# Patient Record
Sex: Female | Born: 1990 | Race: White | Hispanic: No | Marital: Married | Smoking: Never smoker
Health system: Southern US, Community
[De-identification: ages and names within clinical notes are randomized; demographics above are authoritative.]

## PROBLEM LIST (undated history)

## (undated) DIAGNOSIS — F419 Anxiety disorder, unspecified: Secondary | ICD-10-CM

## (undated) DIAGNOSIS — D649 Anemia, unspecified: Secondary | ICD-10-CM

## (undated) DIAGNOSIS — G43909 Migraine, unspecified, not intractable, without status migrainosus: Secondary | ICD-10-CM

## (undated) DIAGNOSIS — J45909 Unspecified asthma, uncomplicated: Secondary | ICD-10-CM

## (undated) DIAGNOSIS — E785 Hyperlipidemia, unspecified: Secondary | ICD-10-CM

## (undated) HISTORY — DX: Hyperlipidemia, unspecified: E78.5

## (undated) HISTORY — DX: Anxiety disorder, unspecified: F41.9

## (undated) HISTORY — DX: Anemia, unspecified: D64.9

## (undated) HISTORY — DX: Unspecified asthma, uncomplicated: J45.909

---
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
## 2010-01-15 ENCOUNTER — Ambulatory Visit: Payer: Self-pay | Admitting: Internal Medicine

## 2010-01-24 ENCOUNTER — Encounter: Payer: Self-pay | Admitting: Internal Medicine

## 2010-01-27 ENCOUNTER — Encounter: Payer: Self-pay | Admitting: Internal Medicine

## 2010-02-02 ENCOUNTER — Telehealth: Payer: Self-pay | Admitting: Internal Medicine

## 2010-07-10 ENCOUNTER — Ambulatory Visit: Payer: Self-pay | Admitting: Internal Medicine

## 2010-07-13 ENCOUNTER — Telehealth: Payer: Self-pay | Admitting: Internal Medicine

## 2010-07-14 ENCOUNTER — Telehealth: Payer: Self-pay | Admitting: Internal Medicine

## 2010-07-15 ENCOUNTER — Encounter: Payer: Self-pay | Admitting: Internal Medicine

## 2010-08-03 ENCOUNTER — Ambulatory Visit: Payer: Self-pay | Admitting: Internal Medicine

## 2010-08-03 DIAGNOSIS — J3089 Other allergic rhinitis: Secondary | ICD-10-CM | POA: Insufficient documentation

## 2010-08-03 DIAGNOSIS — R519 Headache, unspecified: Secondary | ICD-10-CM | POA: Insufficient documentation

## 2010-08-03 DIAGNOSIS — R51 Headache: Secondary | ICD-10-CM

## 2010-08-04 ENCOUNTER — Telehealth (INDEPENDENT_AMBULATORY_CARE_PROVIDER_SITE_OTHER): Payer: Self-pay | Admitting: *Deleted

## 2010-08-06 ENCOUNTER — Telehealth: Payer: Self-pay | Admitting: Internal Medicine

## 2010-08-10 ENCOUNTER — Ambulatory Visit (HOSPITAL_COMMUNITY): Admission: RE | Admit: 2010-08-10 | Discharge: 2010-08-10 | Payer: Self-pay | Admitting: Internal Medicine

## 2010-08-13 ENCOUNTER — Emergency Department (HOSPITAL_COMMUNITY)
Admission: EM | Admit: 2010-08-13 | Discharge: 2010-08-13 | Payer: Self-pay | Source: Home / Self Care | Admitting: Emergency Medicine

## 2010-08-13 ENCOUNTER — Ambulatory Visit: Payer: Self-pay | Admitting: Internal Medicine

## 2010-08-13 DIAGNOSIS — R10813 Right lower quadrant abdominal tenderness: Secondary | ICD-10-CM

## 2010-08-13 LAB — CONVERTED CEMR LAB
AST: 21 units/L (ref 0–37)
Albumin: 4.8 g/dL (ref 3.5–5.2)
Alkaline Phosphatase: 56 units/L (ref 39–117)
Basophils Absolute: 0 10*3/uL (ref 0.0–0.1)
Bilirubin Urine: NEGATIVE
Bilirubin, Direct: 0.1 mg/dL (ref 0.0–0.3)
CO2: 28 meq/L (ref 19–32)
Calcium: 9.5 mg/dL (ref 8.4–10.5)
Creatinine, Ser: 0.8 mg/dL (ref 0.4–1.2)
Eosinophils Absolute: 0 10*3/uL (ref 0.0–0.7)
GFR calc non Af Amer: 96.02 mL/min (ref >60)
Glucose, Bld: 111 mg/dL — ABNORMAL HIGH (ref 70–99)
Leukocytes, UA: NEGATIVE
Lipase: 37 units/L (ref 11.0–59.0)
Lymphocytes Relative: 16.6 % (ref 12.0–46.0)
MCHC: 34.2 g/dL (ref 30.0–36.0)
Monocytes Relative: 5.7 % (ref 3.0–12.0)
Neutrophils Relative %: 77.2 % — ABNORMAL HIGH (ref 43.0–77.0)
Nitrite: NEGATIVE
Platelets: 177 10*3/uL (ref 150.0–400.0)
RDW: 13.1 % (ref 11.5–14.6)
Sodium: 141 meq/L (ref 135–145)
Specific Gravity, Urine: 1.015 (ref 1.000–1.030)
Total Bilirubin: 0.7 mg/dL (ref 0.3–1.2)
pH: 8.5 (ref 5.0–8.0)

## 2010-08-14 ENCOUNTER — Telehealth: Payer: Self-pay | Admitting: Internal Medicine

## 2010-08-14 ENCOUNTER — Ambulatory Visit: Payer: Self-pay | Admitting: Internal Medicine

## 2010-08-14 DIAGNOSIS — N760 Acute vaginitis: Secondary | ICD-10-CM | POA: Insufficient documentation

## 2010-08-14 DIAGNOSIS — N83209 Unspecified ovarian cyst, unspecified side: Secondary | ICD-10-CM | POA: Insufficient documentation

## 2010-10-05 ENCOUNTER — Encounter: Payer: Self-pay | Admitting: Internal Medicine

## 2010-10-27 NOTE — Progress Notes (Signed)
  Phone Note Call from Patient Call back at Home Phone [phone]   Caller: Patient Call For: Etta Grandchild MD Summary of Call: Pt needs note for her college  professors as she has missed 2 days of classes due to being sick.  Initial call taken by: Verdell Face,  August 14, 2010 11:29 AM  Follow-up for Phone Call        Patient picked up letter.Alvy Beal Archie CMA  August 14, 2010 2:12 PM

## 2010-10-27 NOTE — Assessment & Plan Note (Signed)
Summary: sinus inf/migraines/cd   Vital Signs:  Patient profile:   20 year old female Menstrual status:  regular LMP:     07/28/2010 Height:      60 inches Weight:      112 pounds BMI:     21.95 O2 Sat:      98 % on Room air Temp:     97.7 degrees F oral Pulse rate:   80 / minute Pulse rhythm:   regular Resp:     16 per minute BP sitting:   90 / 60  (left arm) Cuff size:   regular  Vitals Entered By: Alysia Penna (August 03, 2010 2:37 PM)  O2 Flow:  Room air CC: pt c/o headache & congestion. /cp sma, Headaches LMP (date): 07/28/2010     Enter LMP: 07/28/2010   Primary Care Provider:  Etta Grandchild MD  CC:  pt c/o headache & congestion. /cp sma and Headaches.  History of Present Illness:  Headaches      This is a 20 year old woman who presents with Headaches.  The symptoms began >1 year ago.  On a scale of 1 to 10, the intensity is described as a 4.  The patient reports nasal congestion, sinus pain, sinus pressure, photophobia, and phonophobia, but denies nausea, vomiting, sweats, and tearing of eyes.  The headache is described as intermittent and pressure-like.  The location of the pain is bitemporal.  The patient denies the following high-risk features: fever, neck pain/stiffness, vision loss or change, focal weakness, altered mental status, rash, trauma, pain worse with exertion, new type of headache, age >50 years, immunosuppression, concomitant infection, and anticoagulation use.  The headaches are precipitated by stress.  Prior treatment has included acetaminophen.    Asthma History    Initial Asthma Severity Rating:    Age range: 12+ years    Symptoms: 0-2 days/week    Nighttime Awakenings: 0-2/month    Interferes w/ normal activity: no limitations    SABA use (not for EIB): 0-2 days/week    Asthma Severity Assessment: Intermittent   Preventive Screening-Counseling & Management  Alcohol-Tobacco     Alcohol drinks/day: 0     Alcohol Counseling: not  indicated; patient does not drink     Smoking Status: never     Tobacco Counseling: not indicated; no tobacco use  Hep-HIV-STD-Contraception     Hepatitis Risk: no risk noted     HIV Risk: no risk noted     STD Risk: no risk noted      Sexual History:  currently monogamous.        Drug Use:  no.        Blood Transfusions:  no.    Medications Prior to Update: 1)  Nuvaring 0.12-0.015 Mg/24hr Ring (Etonogestrel-Ethinyl Estradiol) 2)  Symbicort 160-4.5 Mcg/act Aero (Budesonide-Formoterol Fumarate) .... 2 Inh Two Times A Day  Current Medications (verified): 1)  Nuvaring 0.12-0.015 Mg/24hr Ring (Etonogestrel-Ethinyl Estradiol) 2)  Symbicort 160-4.5 Mcg/act Aero (Budesonide-Formoterol Fumarate) .... 2 Inh Two Times A Day  Allergies (verified): 1)  ! * Adhesives  Past History:  Past Medical History: Last updated: 10/27/2009 Asthma Hyperlipidemia  Past Surgical History: Last updated: 10/27/2009 Denies surgical history  Family History: Last updated: 10/27/2009 Family History of Prostate CA 1st degree relative <50  Social History: Last updated: 10/27/2009 Single Never Smoked Alcohol use-no Drug use-no Regular exercise-yes  Risk Factors: Alcohol Use: 0 (08/03/2010) Exercise: yes (10/27/2009)  Risk Factors: Smoking Status: never (08/03/2010)  Family History:  Reviewed history from 10/27/2009 and no changes required. Family History of Prostate CA 1st degree relative <50  Social History: Reviewed history from 10/27/2009 and no changes required. Single Never Smoked Alcohol use-no Drug use-no Regular exercise-yes  Review of Systems ENT:  Complains of nasal congestion and sinus pressure; denies decreased hearing, difficulty swallowing, ear discharge, earache, hoarseness, nosebleeds, postnasal drainage, ringing in ears, and sore throat.  Physical Exam  General:  alert, well-developed, well-nourished, well-hydrated, appropriate dress, normal appearance,  healthy-appearing, cooperative to examination, and good hygiene.   Head:  normocephalic, atraumatic, no abnormalities observed, and no abnormalities palpated.   Eyes:  No corneal or conjunctival inflammation noted. EOMI. Perrla. Funduscopic exam benign, without hemorrhages, exudates or papilledema. Vision grossly normal. Ears:  R ear normal and L ear normal.   Nose:  no external deformity, no nasal discharge, no airflow obstruction, no intranasal foreign body, no nasal polyps, no nasal mucosal lesions, no mucosal friability, no active bleeding or clots, no sinus percussion tenderness, no septum abnormalities, no nasal discharge, +mucosal pallor, and mucosal edema.   Mouth:  Oral mucosa and oropharynx without lesions or exudates.  Teeth in good repair. Neck:  supple, full ROM, no masses, no thyromegaly, no JVD, normal carotid upstroke, no carotid bruits, no cervical lymphadenopathy, and no neck tenderness.   Lungs:  normal respiratory effort, no intercostal retractions, no accessory muscle use, normal breath sounds, no dullness, no fremitus, no crackles, and no wheezes.   Heart:  normal rate, regular rhythm, no murmur, no gallop, no rub, and no JVD.   Abdomen:  soft, non-tender, normal bowel sounds, no distention, no masses, no guarding, no rigidity, no rebound tenderness, no abdominal hernia, no inguinal hernia, no hepatomegaly, and no splenomegaly.   Msk:  No deformity or scoliosis noted of thoracic or lumbar spine.   Pulses:  R and L carotid,radial,femoral,dorsalis pedis and posterior tibial pulses are full and equal bilaterally Extremities:  No clubbing, cyanosis, edema, or deformity noted with normal full range of motion of all joints.   Neurologic:  No cranial nerve deficits noted. Station and gait are normal. Plantar reflexes are down-going bilaterally. DTRs are symmetrical throughout. Sensory, motor and coordinative functions appear intact. Skin:  Intact without suspicious lesions or  rashes Cervical Nodes:  no anterior cervical adenopathy and no posterior cervical adenopathy.   Axillary Nodes:  no R axillary adenopathy and no L axillary adenopathy.   Inguinal Nodes:  no R inguinal adenopathy and no L inguinal adenopathy.   Psych:  Cognition and judgment appear intact. Alert and cooperative with normal attention span and concentration. No apparent delusions, illusions, hallucinations   Impression & Recommendations:  Problem # 1:  ALLERGIC RHINITIS DUE TO OTHER ALLERGEN (ICD-477.8) Assessment New  Orders: Admin of Therapeutic Inj  intramuscular or subcutaneous (69629) Depo- Medrol 40mg  (J1030) Depo- Medrol 80mg  (J1040)  Problem # 2:  HEADACHE (ICD-784.0) It sounds like she is having severe migraine headaches about 4 times per month so I have referred her to a headache specialist to consider starting a prophylactic med. Orders: Neurology Referral (Neuro)  Complete Medication List: 1)  Nuvaring 0.12-0.015 Mg/24hr Ring (Etonogestrel-ethinyl estradiol) 2)  Symbicort 160-4.5 Mcg/act Aero (Budesonide-formoterol fumarate) .... 2 inh two times a day  Patient Instructions: 1)  Please schedule a follow-up appointment in 1 month.   Medication Administration  Injection # 1:    Medication: Depo- Medrol 80mg     Diagnosis: ALLERGIC RHINITIS DUE TO OTHER ALLERGEN (ICD-477.8)    Route: IM  Site: LUOQ gluteus    Exp Date: 01/2013    Lot #: [id number]    Mfr: [employer]    Patient tolerated injection without complications    Given by: Ami Bullins CMA (August 03, 2010 2:59 PM)  Injection # 2:    Medication: Depo- Medrol 40mg     Diagnosis: ALLERGIC RHINITIS DUE TO OTHER ALLERGEN (ICD-477.8)    Route: IM    Site: LUOQ gluteus    Exp Date: 01/2013    Lot #: [id number]    Mfr: [employer]  Orders Added: 1)  Neurology Referral [Neuro] 2)  Admin of Therapeutic Inj  intramuscular or subcutaneous [96372] 3)  Depo- Medrol 40mg  [J1030] 4)  Depo- Medrol 80mg  [J1040] 5)  Est.  Patient Level V [81191]

## 2010-10-27 NOTE — Progress Notes (Signed)
Summary: MRI  Phone Note Call from Patient Call back at Home Phone [phone]   Caller: Leanne Lovely Summary of Call: Pt's mother called stating that pt's pediatrician is conerned that pt was referred to have MRI Brain w/ contrast with her family history of death due to aneurysm (Aunt) and brain hemmorhage (Grandmother). Pt's mother said she called earlier this week with this concerned and was advised that she would be contacted by Dr Yetta Barre. Initial call taken by: Margaret Pyle, CMA,  August 06, 2010 1:58 PM  Follow-up for Phone Call        i did not get this info in the prior message, tell her I am very sorry that no one told me of the family history and the request for an MRI Follow-up by: Etta Grandchild MD,  August 06, 2010 2:36 PM  Additional Follow-up for Phone Call Additional follow up Details #1::        Pt informed  Additional Follow-up by: Lamar Sprinkles, CMA,  August 06, 2010 2:53 PM

## 2010-10-27 NOTE — Progress Notes (Signed)
Summary: referral  ---- Converted from flag ---- ---- 08/04/2010 10:03 AM, Etta Grandchild MD wrote: yes  ---- 08/04/2010 9:51 AM, Shelbie Proctor wrote: Dr Yetta Barre  called Dr lewitt office  reguarding  referral The next available appt is Late Dec -early Jan before pt can get in , pt mother want to  know can [facility] be [facility] pls advise ------------------------------

## 2010-10-27 NOTE — Progress Notes (Signed)
Summary: Out of school note and RX ?  Phone Note Call from Patient Call back at Home Phone [phone]   Caller: Mom Summary of Call: Pt's mother called stating pt need Dr note for work and school for Sunday and Today. Mom also states that meds called in at [facility] were not sent to the correct pharmacy. Please call pt mother back. Initial call taken by: Margaret Pyle, CMA,  July 13, 2010 2:46 PM  Follow-up for Phone Call        I spoke to pt's mother this am. She states pt contacted on call doc lastnight and has everything she needs now. Close phone note Follow-up by: Lanier Prude, [facility]),  July 14, 2010 8:50 AM

## 2010-10-27 NOTE — Assessment & Plan Note (Signed)
Summary: NEW/[employer]/CD   Vital Signs:  Patient profile:   20 year old female Menstrual status:  regular LMP:     10/01/2009 Height:      60 inches Weight:      103 pounds BMI:     20.19 O2 Sat:      96 % on Room air Temp:     98.7 degrees F oral Pulse rate:   68 / minute Pulse rhythm:   regular Resp:     16 per minute BP sitting:   108 / 82  (left arm) Cuff size:   large  Vitals Entered By: Rock Nephew CMA (October 27, 2009 2:56 PM)  O2 Flow:  Room air  Primary Care Provider:  Etta Grandchild MD  CC:  URI symptoms.  History of Present Illness:  URI Symptoms      This is an 20 year old woman who presents with URI symptoms.  The symptoms began 2 weeks ago.  The severity is described as moderate.  The patient reports nasal congestion, purulent nasal discharge, productive cough, and sick contacts, but denies earache.  The patient denies fever, stiff neck, dyspnea, wheezing, rash, vomiting, diarrhea, and use of an antipyretic.  The patient denies headache, muscle aches, and severe fatigue.  Risk factors for Strep sinusitis include unilateral facial pain, unilateral nasal discharge, poor response to decongestant, double sickening, and absence of cough.  The patient denies the following risk factors for Strep sinusitis: tender adenopathy.    Preventive Screening-Counseling & Management  Alcohol-Tobacco     Alcohol drinks/day: 0     Smoking Status: never  Caffeine-Diet-Exercise     Does Patient Exercise: yes  Hep-HIV-STD-Contraception     Hepatitis Risk: no risk noted     HIV Risk: no risk noted     STD Risk: no risk noted      Sexual History:  currently monogamous.        Drug Use:  no.        Blood Transfusions:  no.    Medications Prior to Update: 1)  None  Current Medications (verified): 1)  Nuvaring 0.12-0.015 Mg/24hr Ring (Etonogestrel-Ethinyl Estradiol) 2)  Amoxicillin 500 Mg Cap (Amoxicillin) .... Take 1 Capsule By Mouth Three Times A Day X 10 Days 3)   Guaifenesin Dac 30-10-100 Mg/88ml Soln (Pseudoephedrine-Codeine-Gg) .... 5-10 Ml By Mouth Qid As Needed For Cough and Congestion  Allergies (verified): 1)  ! * Adhesives  Past History:  Past Medical History: Asthma Hyperlipidemia  Past Surgical History: Denies surgical history  Family History: Family History of Prostate CA 1st degree relative <50  Social History: Single Never Smoked Alcohol use-no Drug use-no Regular exercise-yes Smoking Status:  never Hepatitis Risk:  no risk noted HIV Risk:  no risk noted STD Risk:  no risk noted Sexual History:  currently monogamous Blood Transfusions:  no Drug Use:  no Does Patient Exercise:  yes  Review of Systems  The patient denies anorexia, fever, weight loss, chest pain, headaches, hemoptysis, abdominal pain, hematuria, suspicious skin lesions, and enlarged lymph nodes.    Physical Exam  General:  alert, well-developed, well-nourished, well-hydrated, appropriate dress, normal appearance, healthy-appearing, and cooperative to examination.   Head:  normocephalic, atraumatic, no abnormalities observed, and no abnormalities palpated.   Ears:  R ear normal and L ear normal.   Nose:  no airflow obstruction, no intranasal foreign body, no nasal polyps, no nasal mucosal lesions, no mucosal friability, no active bleeding or clots, nasal dischargemucosal pallor, mucosal  edema, L maxillary sinus tenderness, and R maxillary sinus tenderness.   Mouth:  Oral mucosa and oropharynx without lesions or exudates.  Teeth in good repair. Neck:  No deformities, masses, or tenderness noted. Lungs:  Normal respiratory effort, chest expands symmetrically. Lungs are clear to auscultation, no crackles or wheezes. Heart:  Normal rate and regular rhythm. S1 and S2 normal without gallop, murmur, click, rub or other extra sounds. Abdomen:  soft, non-tender, normal bowel sounds, no distention, no masses, no guarding, no rigidity, no rebound tenderness, no  hepatomegaly, and no splenomegaly.   Msk:  normal ROM, no joint tenderness, no joint swelling, no joint warmth, no redness over joints, no joint deformities, no joint instability, and no crepitation.   Pulses:  R and L carotid,radial,femoral,dorsalis pedis and posterior tibial pulses are full and equal bilaterally Extremities:  No clubbing, cyanosis, edema, or deformity noted with normal full range of motion of all joints.   Neurologic:  No cranial nerve deficits noted. Station and gait are normal. Plantar reflexes are down-going bilaterally. DTRs are symmetrical throughout. Sensory, motor and coordinative functions appear intact. Skin:  Intact without suspicious lesions or rashes Cervical Nodes:  no anterior cervical adenopathy and no posterior cervical adenopathy.   Axillary Nodes:  no R axillary adenopathy and no L axillary adenopathy.   Inguinal Nodes:  no R inguinal adenopathy and no L inguinal adenopathy.   Psych:  Cognition and judgment appear intact. Alert and cooperative with normal attention span and concentration. No apparent delusions, illusions, hallucinations   Impression & Recommendations:  Problem # 1:  SINUSITIS- ACUTE-NOS (ICD-461.9) Assessment New  Her updated medication list for this problem includes:    Amoxicillin 500 Mg Cap (Amoxicillin) .Marland Kitchen... Take 1 capsule by mouth three times a day x 10 days    Guaifenesin Dac 30-10-100 Mg/42ml Soln (Pseudoephedrine-codeine-gg) .Marland Kitchen... 5-10 ml by mouth qid as needed for cough and congestion  Complete Medication List: 1)  Nuvaring 0.12-0.015 Mg/24hr Ring (Etonogestrel-ethinyl estradiol) 2)  Amoxicillin 500 Mg Cap (Amoxicillin) .... Take 1 capsule by mouth three times a day x 10 days 3)  Guaifenesin Dac 30-10-100 Mg/96ml Soln (Pseudoephedrine-codeine-gg) .... 5-10 ml by mouth qid as needed for cough and congestion  Patient Instructions: 1)  Please schedule a follow-up appointment in 2 weeks. 2)  Take your antibiotic as prescribed until  ALL of it is gone, but stop if you develop a rash or swelling and contact our office as soon as possible. 3)  Acute sinusitis symptoms for less than 10 days are not helped by antibiotics.Use warm moist compresses, and over the counter decongestants ( only as directed). Call if no improvement in 5-7 days, sooner if increasing pain, fever, or new symptoms. Prescriptions: GUAIFENESIN DAC 30-10-100 MG/5ML SOLN (PSEUDOEPHEDRINE-CODEINE-GG) 5-10 ml by mouth QID as needed for cough and congestion  #6 ounces x 0   Entered and Authorized by:   Etta Grandchild MD   Signed by:   Etta Grandchild MD on 10/27/2009   Method used:   Print then Give to Patient   RxID:   [id number] AMOXICILLIN 500 MG CAP (AMOXICILLIN) Take 1 capsule by mouth three times a day X 10 days  #30 x 0   Entered and Authorized by:   Etta Grandchild MD   Signed by:   Etta Grandchild MD on 10/27/2009   Method used:   Print then Give to Patient   RxID:   [id number]

## 2010-10-27 NOTE — Assessment & Plan Note (Signed)
Summary: 3 RD HEP A AND B/TLJ/WILL BRING RECORDS/NWS  Nurse Visit   Allergies: 1)  ! * Adhesives  Immunizations Administered:  HPV # 3:    Vaccine Type: Gardasil    Site: right deltoid    Mfr: Merck    Dose: 0.5 ml    Route: IM    Given by: Lamar Sprinkles, CMA    Exp. Date: 07/12/2011    Lot #: [id number]    VIS given: 10/29/05 version given January 15, 2010.  Orders Added: 1)  HPV Vaccine - 3 sched doses - IM [90649] 2)  Admin 1st Vaccine [29562]

## 2010-10-27 NOTE — Miscellaneous (Signed)
  Clinical Lists Changes  Observations: Added new observation of MENINGOC VAX: Historical (11/09/2004 10:21)      Immunization History:  Meningococcal Immunization History:    Meningococcal:  historical (11/09/2004)

## 2010-10-27 NOTE — Assessment & Plan Note (Signed)
Summary: FU ON CRAMPS/[employer]   Vital Signs:  Patient profile:   20 year old female Menstrual status:  regular Height:      60 inches Weight:      112 pounds BMI:     21.95 O2 Sat:      98 % on Room air Temp:     97.8 degrees F oral Pulse rate:   62 / minute Pulse rhythm:   regular Resp:     16 per minute BP sitting:   108 / 70  (left arm) Cuff size:   regular  Vitals Entered By: Rock Nephew CMA (August 14, 2010 10:02 AM)  O2 Flow:  Room air CC: ER follow up Is Patient Diabetic? No Pain Assessment Patient in pain? no       Does patient need assistance? Functional Status Self care Ambulation Normal   Primary Care Provider:  Etta Grandchild MD  CC:  ER follow up.  History of Present Illness: She returns for f/up and feels better with a decrease in her abd. pain, her CT scan yesterday in the ER showed a small right ovarian cyst but her appendix was normal. She had a pelvic exam done in the ER and is being treated for BV. She has not taken anything for pain or nausea since she left the ER.  Preventive Screening-Counseling & Management  Alcohol-Tobacco     Alcohol drinks/day: 0     Alcohol Counseling: not indicated; patient does not drink     Smoking Status: never     Tobacco Counseling: not indicated; no tobacco use  Hep-HIV-STD-Contraception     Hepatitis Risk: no risk noted     HIV Risk: no risk noted     STD Risk: no risk noted      Sexual History:  currently monogamous.        Drug Use:  no.        Blood Transfusions:  no.    Clinical Review Panels:  Immunizations   Last Tetanus Booster:  Td (03/28/2006)   Last Flu Vaccine:  Historical (06/23/2009)  Diabetes Management   Creatinine:  0.8 (08/13/2010)   Last Flu Vaccine:  Historical (06/23/2009)  CBC   WBC:  11.1 (08/13/2010)   RBC:  4.70 (08/13/2010)   Hgb:  14.9 (08/13/2010)   Hct:  43.5 (08/13/2010)   Platelets:  177.0 (08/13/2010)   MCV  92.7 (08/13/2010)   MCHC  34.2 (08/13/2010)  RDW  13.1 (08/13/2010)   PMN:  77.2 (08/13/2010)   Lymphs:  16.6 (08/13/2010)   Monos:  5.7 (08/13/2010)   Eosinophils:  0.2 (08/13/2010)   Basophil:  0.3 (08/13/2010)  Complete Metabolic Panel   Glucose:  111 (08/13/2010)   Sodium:  141 (08/13/2010)   Potassium:  4.3 (08/13/2010)   Chloride:  102 (08/13/2010)   CO2:  28 (08/13/2010)   BUN:  7 (08/13/2010)   Creatinine:  0.8 (08/13/2010)   Albumin:  4.8 (08/13/2010)   Total Protein:  7.6 (08/13/2010)   Calcium:  9.5 (08/13/2010)   Total Bili:  0.7 (08/13/2010)   Alk Phos:  56 (08/13/2010)   SGPT (ALT):  18 (08/13/2010)   SGOT (AST):  21 (08/13/2010)   Medications Prior to Update: 1)  Nuvaring 0.12-0.015 Mg/24hr Ring (Etonogestrel-Ethinyl Estradiol) 2)  Symbicort 160-4.5 Mcg/act Aero (Budesonide-Formoterol Fumarate) .... 2 Inh Two Times A Day  Current Medications (verified): 1)  Nuvaring 0.12-0.015 Mg/24hr Ring (Etonogestrel-Ethinyl Estradiol) 2)  Symbicort 160-4.5 Mcg/act Aero (Budesonide-Formoterol Fumarate) .... 2 Inh Two  Times A Day 3)  Flagyl 500 Mg Tabs (Metronidazole) .... Take 1 Tablet By Mouth Two Times A Day X14 4)  Zofran 8 Mg Tabs (Ondansetron Hcl) .... Every 4-6 Hours 5)  Hydrocodone-Acetaminophen 5-325 Mg Tabs (Hydrocodone-Acetaminophen) .Marland Kitchen.. 1-2 Every 4-6 Hours As Needed  Allergies (verified): 1)  ! * Adhesives  Past History:  Past Medical History: Last updated: 10/27/2009 Asthma Hyperlipidemia  Past Surgical History: Last updated: 10/27/2009 Denies surgical history  Family History: Last updated: 10/27/2009 Family History of Prostate CA 1st degree relative <50  Social History: Last updated: 10/27/2009 Single Never Smoked Alcohol use-no Drug use-no Regular exercise-yes  Risk Factors: Alcohol Use: 0 (08/14/2010) Exercise: yes (10/27/2009)  Risk Factors: Smoking Status: never (08/14/2010)  Family History: Reviewed history from 10/27/2009 and no changes required. Family History of  Prostate CA 1st degree relative <50  Social History: Reviewed history from 10/27/2009 and no changes required. Single Never Smoked Alcohol use-no Drug use-no Regular exercise-yes  Review of Systems       The patient complains of abdominal pain.  The patient denies anorexia, fever, hematuria, and enlarged lymph nodes.   GI:  Complains of abdominal pain; denies bloody stools, diarrhea, gas, hemorrhoids, indigestion, loss of appetite, nausea, vomiting, vomiting blood, and yellowish skin color. GU:  Denies abnormal vaginal bleeding, discharge, dysuria, genital sores, hematuria, incontinence, nocturia, urinary frequency, and urinary hesitancy.  Physical Exam  General:  alert, well-developed, well-nourished, well-hydrated, appropriate dress, normal appearance, healthy-appearing, cooperative to examination, and good hygiene.   Head:  normocephalic, atraumatic, no abnormalities observed, and no abnormalities palpated.   Eyes:  No corneal or conjunctival inflammation noted. EOMI. Perrla. Funduscopic exam benign, without hemorrhages, exudates or papilledema. Vision grossly normal. Ears:  R ear normal and L ear normal.   Nose:  External nasal examination shows no deformity or inflammation. Nasal mucosa are pink and moist without lesions or exudates. Mouth:  Oral mucosa and oropharynx without lesions or exudates.  Teeth in good repair. Neck:  supple, full ROM, no masses, no thyromegaly, no thyroid nodules or tenderness, no JVD, normal carotid upstroke, and no carotid bruits.   Lungs:  normal respiratory effort, no intercostal retractions, no accessory muscle use, normal breath sounds, no dullness, no fremitus, no crackles, and no wheezes.   Heart:  normal rate, regular rhythm, no murmur, no gallop, and no rub.   Abdomen:  soft, non-tender, normal bowel sounds, no distention, no masses, no guarding, no rigidity, no rebound tenderness, no abdominal hernia, no inguinal hernia, no hepatomegaly, and no  splenomegaly.   Msk:  No deformity or scoliosis noted of thoracic or lumbar spine.   Pulses:  R and L carotid,radial,femoral,dorsalis pedis and posterior tibial pulses are full and equal bilaterally Extremities:  No clubbing, cyanosis, edema, or deformity noted with normal full range of motion of all joints.   Neurologic:  No cranial nerve deficits noted. Station and gait are normal. Plantar reflexes are down-going bilaterally. DTRs are symmetrical throughout. Sensory, motor and coordinative functions appear intact. Skin:  Intact without suspicious lesions or rashes Cervical Nodes:  no anterior cervical adenopathy and no posterior cervical adenopathy.   Psych:  Cognition and judgment appear intact. Alert and cooperative with normal attention span and concentration. No apparent delusions, illusions, hallucinations   Impression & Recommendations:  Problem # 1:  ABDOMINAL TENDERNESS RIGHT LOWER QUADRANT (EAV-409.81) Assessment Improved  Problem # 2:  VAGINITIS, BACTERIAL (ICD-616.10) Assessment: New  Her updated medication list for this problem includes:    Flagyl 500  Mg Tabs (Metronidazole) .Marland Kitchen... Take 1 tablet by mouth two times a day x14  Problem # 3:  OVARIAN CYST (ICD-620.2) Assessment: New  Complete Medication List: 1)  Nuvaring 0.12-0.015 Mg/24hr Ring (Etonogestrel-ethinyl estradiol) 2)  Symbicort 160-4.5 Mcg/act Aero (Budesonide-formoterol fumarate) .... 2 inh two times a day 3)  Flagyl 500 Mg Tabs (Metronidazole) .... Take 1 tablet by mouth two times a day x14 4)  Zofran 8 Mg Tabs (Ondansetron hcl) .... Every 4-6 hours 5)  Hydrocodone-acetaminophen 5-325 Mg Tabs (Hydrocodone-acetaminophen) .Marland Kitchen.. 1-2 every 4-6 hours as needed  Patient Instructions: 1)  Please schedule a follow-up appointment in 2 weeks.   Orders Added: 1)  Est. Patient Level III [[address]]

## 2010-10-27 NOTE — Letter (Signed)
Summary: Out of [facility]  [address] [place], [place] [address]   Phone: [phone]  Fax: [phone]    August 14, 2010   Student:  Eileen Stanford    To Whom It May Concern:   For Medical reasons, please excuse the above named student from school for the following dates:  Start:   August 13, 2010  End:    August 17, 2010  If you need additional information, please feel free to contact our office.   Sincerely,    Etta Grandchild MD    ****This is a legal document and cannot be tampered with.  Schools are authorized to verify all information and to do so accordingly.

## 2010-10-27 NOTE — Progress Notes (Signed)
Summary: note  Phone Note Call from Patient Call back at Home Phone [phone]   Summary of Call: Patient left message on triage that she was sick on Friday and needs a note for the airport. Please advise. Initial call taken by: Lucious Groves,  Feb 02, 2010 10:41 AM  Follow-up for Phone Call        it would be a fraudulant act for me to write a note since I did not see her Friday Follow-up by: Etta Grandchild MD,  Feb 02, 2010 11:14 AM

## 2010-10-27 NOTE — Assessment & Plan Note (Signed)
Summary: cough/sniffles/Gina Stephenson/cd   Vital Signs:  Patient profile:   20 year old female Menstrual status:  regular Height:      60 inches Weight:      114 pounds BMI:     22.34 Temp:     97.5 degrees F oral Pulse rate:   76 / minute Pulse rhythm:   regular Resp:     16 per minute BP sitting:   102 / 68  (left arm) Cuff size:   regular  Vitals Entered By: Lanier Prude, Beverly Gust) (July 10, 2010 3:02 PM) CC: cough/congestion X 1 week Is Patient Diabetic? No   Primary Care Helane Briceno:  Etta Grandchild MD  CC:  cough/congestion X 1 week.  History of Present Illness: The patient presents with complaints of sore throat, fever, cough, sinus congestion and drainge of several days duration. Not better with OTC meds. Chest hurts with coughing. Can't sleep due to cough.   The mucus is colored.   Current Medications (verified): 1)  Nuvaring 0.12-0.015 Mg/24hr Ring (Etonogestrel-Ethinyl Estradiol) 2)  Guaifenesin Dac 30-10-100 Mg/59ml Soln (Pseudoephedrine-Codeine-Gg) .... 5-10 Ml By Mouth Qid As Needed For Cough and Congestion  Allergies (verified): 1)  ! * Adhesives  Past History:  Past Medical History: Last updated: 10/27/2009 Asthma Hyperlipidemia  Review of Systems  The patient denies fever and abdominal pain.    Physical Exam  General:  alert, well-developed, well-nourished, well-hydrated, appropriate dress, normal appearance, healthy-appearing, and cooperative to examination.   Eyes:  No corneal or conjunctival inflammation noted. EOMI. Perrla. Funduscopic exam benign, without hemorrhages, exudates or papilledema. Vision grossly normal. Ears:  External ear exam shows no significant lesions or deformities.  Otoscopic examination reveals clear canals, tympanic membranes are intact bilaterally without bulging, retraction, inflammation or discharge. Hearing is grossly normal bilaterally. Nose:  External nasal examination shows no deformity or inflammation. Nasal mucosa are  pink and moist without lesions or exudates. Mouth:  Erythematous throat and intranasal mucosa c/w URI  Lungs:  CTA Heart:  RRR Abdomen:  soft and non-tender.   Skin:  Intact without suspicious lesions or rashes   Impression & Recommendations:  Problem # 1:  BRONCHITIS, ACUTE (ICD-466.0) Assessment New     Zithromax Z-pak 250 Mg Tabs (Azithromycin) .Marland Kitchen... As dirrected    Symbicort 160-4.5 Mcg/act Aero (Budesonide-formoterol fumarate) .Marland Kitchen... 2 inh two times a day  Problem # 2:  ASTHMA (ICD-493.90) Assessment: Deteriorated  Her updated medication list for this problem includes:    Symbicort 160-4.5 Mcg/act Aero (Budesonide-formoterol fumarate) .Marland Kitchen... 2 inh two times a day  Complete Medication List: 1)  Nuvaring 0.12-0.015 Mg/24hr Ring (Etonogestrel-ethinyl estradiol) 2)  Zithromax Z-pak 250 Mg Tabs (Azithromycin) .... As dirrected 3)  Symbicort 160-4.5 Mcg/act Aero (Budesonide-formoterol fumarate) .... 2 inh two times a day  Patient Instructions: 1)  Use over-the-counter medicines for "cold": Tylenol  650mg  or Advil 400mg  every 6 hours  for fever; Delsym or Robutussin for cough. Mucinex or Mucinex D for congestion. Ricola or Halls for sore throat. Office visit if not better or if worse.  Prescriptions: SYMBICORT 160-4.5 MCG/ACT AERO (BUDESONIDE-FORMOTEROL FUMARATE) 2 inh two times a day  #1 x 3   Entered and Authorized by:   Tresa Garter MD   Signed by:   Tresa Garter MD on 07/10/2010   Method used:   Print then Give to Patient   RxID:   [id number] ZITHROMAX Z-PAK 250 MG TABS (AZITHROMYCIN) as dirrected  #1 x 0   Entered and Authorized by:  Tresa Garter MD   Signed by:   Tresa Garter MD on 07/10/2010   Method used:   Electronically to        [facility] [address] [phone]* (retail)       [address] [place], [place]  [address]       Ph: [phone]       Fax: [phone]   RxID:   [id number]

## 2010-10-27 NOTE — Progress Notes (Signed)
Summary: work/school note  Phone Note Call from Patient Call back at [phone]   Caller: Patient Reason for Call: Talk to Nurse Summary of Call: pt request work/school note from 07/10/10-07/14/10, request to pick up tomorrow by 10:30am Initial call taken by: Migdalia Dk,  July 14, 2010 3:26 PM

## 2010-10-27 NOTE — Miscellaneous (Signed)
Summary: [facility]  Clinical Lists Changes  Observations: Added new observation of HPV #2 DRUG: Gardasil (08/29/2009 12:37) Added new observation of HPV #1 DRUG: Gardasil (08/27/2009 12:37) Added new observation of FLU VAX: Historical (06/23/2009 12:36) Added new observation of HEPAVAX #2: Historical (10/10/2006 12:42) Added new observation of HEPAVAX #1: Historical (03/28/2006 12:42) Added new observation of TD BOOSTER: Td (03/28/2006 12:38) Added new observation of OPV #4: Historical (10/24/1997 12:41) Added new observation of MMR #2: Historical (10/12/1995 12:41) Added new observation of OPV #3: Historical (10/12/1995 12:41) Added new observation of HEPBVAX#3: Historical (10/27/1993 12:42) Added new observation of HEPBVAX#2: Historical (12/10/1992 12:42) Added new observation of HEPBVAX#1: Historical (11/03/1992 12:42) Added new observation of OPV #2: Historical (05/23/1992 12:41) Added new observation of DPT #4: Historical (05/23/1992 12:41) Added new observation of MMR #1: Historical (02/01/1992 12:41) Added new observation of DPT #3: Historical (05/04/1991 12:41) Added new observation of OPV #1: Historical (03/14/1991 12:41) Added new observation of DPT #2: Historical (03/14/1991 12:41) Added new observation of DPT #1: Historical (12/26/1990 12:41)      Immunization History:  Influenza Immunization History:    Influenza:  historical (06/23/2009)  HPV History:    HPV # 1:  gardasil (08/27/2009)    HPV # 2:  gardasil (08/29/2009)  Tetanus/Td Immunization History:    Tetanus/Td:  td (03/28/2006)  DPT Immunization History:    DPT # 1:  historical (12/26/1990)    DPT # 2:  historical (03/14/1991)    DPT # 3:  historical (05/04/1991)    DPT # 4:  historical (05/23/1992)  Polio Immunization History:    Polio # 1:  historical (03/14/1991)    Polio # 2:  historical (05/23/1992)    Polio # 3:  historical (10/12/1995)    Polio # 4:  historical  (10/24/1997)  MMR Immunization History:    MMR # 1:  historical (02/01/1992)    MMR # 2:  historical (10/12/1995)  Hepatitis B Immunization History:    Hepatitis B # 1:  historical (11/03/1992)    Hepatitis B # 2:  historical (12/10/1992)    Hepatitis B # 3:  historical (10/27/1993)  Hepatitis A Immunization History:    Hepatitis A # 1:  historical (03/28/2006)    Hepatitis A # 2:  historical (10/10/2006)

## 2010-10-27 NOTE — Progress Notes (Signed)
Summary: Call Report  Phone Note Other Incoming   Caller: [facility] Summary of Call: [facility] Triage Call Report Triage Record Num: [id number] Operator: Aundra Millet Patient Name: Gina Stephenson Call Date & Time: 07/13/2010 9:16:43PM Patient Phone: [phone] PCP: Patient Gender: Female PCP Fax : Patient DOB: [birth year] Practice Name: [facility] Reason for Call: Mom/ Sheri calling from [place]- Pt is in [place] in [place] and lives in [place] -- Pt had OV 07/10/2010 for cough/ congestion and prescribed rx . On 07/11/2010, pt went to pickup at [facility] . and it wasnt there. Mother and Pt called office about rx today, 07/13/2010, and no reply. Pt doesnt have a working phone number and has been using a friends phone but it isnt always available. Mom doesnt know which Dr pt sees nor which office . RN advised to contact office in am about rx, and RN adv MOm to have pt call us back if she has access to phone , o/w advised ED if sx's worsen before office reopens. Protocol(s) Used: Office Note Recommended Outcome per Protocol: Information Noted and Sent to Office Reason for Outcome: Caller information to office Care Advice:  ~ 10/ Initial call taken by: Margaret Pyle, CMA,  July 14, 2010 7:58 AM  Follow-up for Phone Call        Sent to Dr Posey Rea in error Follow-up by: Margaret Pyle, CMA,  July 15, 2010 8:32 AM

## 2010-10-27 NOTE — Assessment & Plan Note (Signed)
Summary: ABD CRAMPS/CD   Vital Signs:  Patient profile:   20 year old female Menstrual status:  regular Height:      60 inches Weight:      112 pounds O2 Sat:      99 % on Room air Temp:     98.3 degrees F oral Pulse rate:   120 / minute Pulse rhythm:   regular Resp:     16 per minute BP sitting:   104 / 64  (left arm) Cuff size:   regular  Vitals Entered By: Rock Nephew CMA (August 13, 2010 11:03 AM)  O2 Flow:  Room air CC: Patient c/o abdominal pain w/ vomitinf x this am, Abdominal pain Is Patient Diabetic? No Pain Assessment Patient in pain? yes     Location: abdomen  Does patient need assistance? Functional Status Self care Ambulation Normal   Primary Care Provider:  Etta Grandchild MD  CC:  Patient c/o abdominal pain w/ vomitinf x this am and Abdominal pain.  History of Present Illness:  Abdominal Pain      This is a 20 year old woman who presents with Abdominal pain.  The symptoms began 4-8 hrs ago.  On a scale of 1 to 10, the intensity is described as a 6.  The patient reports nausea, vomiting, and anorexia, but denies diarrhea, constipation, melena, hematochezia, and hematemesis.  The location of the pain is periumbilical moving to RLQ.  The pain is described as constant, sharp, and cramping in quality.  The patient denies the following symptoms: fever, weight loss, dysuria, chest pain, jaundice, dark urine, missed menstrual period, and vaginal bleeding.    Preventive Screening-Counseling & Management  Alcohol-Tobacco     Alcohol drinks/day: 0     Alcohol Counseling: not indicated; patient does not drink     Smoking Status: never     Tobacco Counseling: not indicated; no tobacco use  Hep-HIV-STD-Contraception     Hepatitis Risk: no risk noted     HIV Risk: no risk noted     STD Risk: no risk noted      Sexual History:  currently monogamous.        Drug Use:  no.        Blood Transfusions:  no.    Medications Prior to Update: 1)  Nuvaring  0.12-0.015 Mg/24hr Ring (Etonogestrel-Ethinyl Estradiol) 2)  Symbicort 160-4.5 Mcg/act Aero (Budesonide-Formoterol Fumarate) .... 2 Inh Two Times A Day  Current Medications (verified): 1)  Nuvaring 0.12-0.015 Mg/24hr Ring (Etonogestrel-Ethinyl Estradiol) 2)  Symbicort 160-4.5 Mcg/act Aero (Budesonide-Formoterol Fumarate) .... 2 Inh Two Times A Day  Allergies (verified): 1)  ! * Adhesives  Past History:  Past Medical History: Last updated: 10/27/2009 Asthma Hyperlipidemia  Past Surgical History: Last updated: 10/27/2009 Denies surgical history  Family History: Last updated: 10/27/2009 Family History of Prostate CA 1st degree relative <50  Social History: Last updated: 10/27/2009 Single Never Smoked Alcohol use-no Drug use-no Regular exercise-yes  Risk Factors: Alcohol Use: 0 (08/13/2010) Exercise: yes (10/27/2009)  Risk Factors: Smoking Status: never (08/13/2010)  Family History: Reviewed history from 10/27/2009 and no changes required. Family History of Prostate CA 1st degree relative <50  Social History: Reviewed history from 10/27/2009 and no changes required. Single Never Smoked Alcohol use-no Drug use-no Regular exercise-yes  Review of Systems       The patient complains of anorexia and abdominal pain.  The patient denies fever, weight loss, weight gain, chest pain, peripheral edema, prolonged cough, melena, hematochezia, severe  indigestion/heartburn, hematuria, suspicious skin lesions, and enlarged lymph nodes.    Physical Exam  General:  alert, well-developed, well-nourished, well-hydrated, appropriate dress, normal appearance, healthy-appearing, and uncomfortable-appearing.   Head:  normocephalic, atraumatic, no abnormalities observed, and no abnormalities palpated.   Eyes:  no injection or icterus Mouth:  Oral mucosa and oropharynx without lesions or exudates.  Teeth in good repair. Neck:  supple, full ROM, no masses, no thyromegaly, no JVD,  normal carotid upstroke, no carotid bruits, no cervical lymphadenopathy, and no neck tenderness.   Lungs:  normal respiratory effort, no intercostal retractions, no accessory muscle use, normal breath sounds, no dullness, no fremitus, no crackles, and no wheezes.   Heart:  normal rate, regular rhythm, no murmur, no gallop, no rub, and no JVD.   Abdomen:  soft, normal bowel sounds, no distention, no masses, no guarding, no rigidity, no abdominal hernia, no inguinal hernia, no hepatomegaly, no splenomegaly, and RLQ tenderness.   Msk:  No deformity or scoliosis noted of thoracic or lumbar spine.   Pulses:  R and L carotid,radial,femoral,dorsalis pedis and posterior tibial pulses are full and equal bilaterally Extremities:  No clubbing, cyanosis, edema, or deformity noted with normal full range of motion of all joints.   Neurologic:  No cranial nerve deficits noted. Station and gait are normal. Plantar reflexes are down-going bilaterally. DTRs are symmetrical throughout. Sensory, motor and coordinative functions appear intact. Skin:  Intact without suspicious lesions or rashes Cervical Nodes:  no anterior cervical adenopathy and no posterior cervical adenopathy.   Inguinal Nodes:  no R inguinal adenopathy and no L inguinal adenopathy.   Psych:  Cognition and judgment appear intact. Alert and cooperative with normal attention span and concentration. No apparent delusions, illusions, hallucinations   Impression & Recommendations:  Problem # 1:  ABDOMINAL TENDERNESS RIGHT LOWER QUADRANT (WJX-914.78) Assessment New  Orders: Radiology Referral (Radiology) TLB-BMP (Basic Metabolic Panel-BMET) (80048-METABOL) TLB-Preg Serum Quant (B-hCG) (84702-HCG-QN) TLB-Amylase (82150-AMYL) TLB-Lipase (83690-LIPASE) TLB-Udip w/ Micro (81001-URINE) 1:00 pm, we can't get anyone from her insurance com on the phone for precert and I am told that CT will not be done until she is precerted 1:33 pm, we have not been able  to get a CT scan precerted and we are told it is [place] and we are told that this it out of network, I believe she has an acute appendicitis so will send her directly to the ER  Complete Medication List: 1)  Nuvaring 0.12-0.015 Mg/24hr Ring (Etonogestrel-ethinyl estradiol) 2)  Symbicort 160-4.5 Mcg/act Aero (Budesonide-formoterol fumarate) .... 2 inh two times a day  Other Orders: Venipuncture (29562) TLB-CBC Platelet - w/Differential (85025-CBCD) TLB-Hepatic/Liver Function Pnl (80076-HEPATIC)   Orders Added: 1)  Venipuncture [13086] 2)  Radiology Referral [Radiology] 3)  TLB-BMP (Basic Metabolic Panel-BMET) [80048-METABOL] 4)  TLB-CBC Platelet - w/Differential [85025-CBCD] 5)  TLB-Hepatic/Liver Function Pnl [80076-HEPATIC] 6)  TLB-Preg Serum Quant (B-hCG) [84702-HCG-QN] 7)  TLB-Amylase [82150-AMYL] 8)  TLB-Lipase [83690-LIPASE] 9)  TLB-Udip w/ Micro [81001-URINE] 10)  Est. Patient Level V [57846]

## 2010-10-27 NOTE — Letter (Signed)
Summary: Out of [facility]  [address] [place], [place] [address]   Phone: [phone]  Fax: [phone]    July 15, 2010   Student:  Eileen Stanford    To Whom It May Concern:   For Medical reasons, please excuse the above named student from school for the following dates:  Start:   July 10, 2010  End:    July 14, 2010  If you need additional information, please feel free to contact our office.   Sincerely,    Etta Grandchild MD    ****This is a legal document and cannot be tampered with.  Schools are authorized to verify all information and to do so accordingly.

## 2010-10-27 NOTE — Progress Notes (Signed)
Summary: Call Report  Phone Note Other Incoming   Caller: [facility] Summary of Call: [facility] Triage Call Report Triage Record Num: [id number] Operator: Aundra Millet Patient Name: Gina Stephenson Call Date & Time: 07/13/2010 9:43:53PM Patient Phone: [phone] PCP: Sanda Linger Patient Gender: Female PCP Fax : Patient DOB: Oct 07, 1990 Practice Name: [facility] Reason for Call: LMP 07/06/2010 -- Pt calling back stating that she saw Dr Macario Golds on 07/10/2010 for cough/ congesiton and a rx was supposed to be sent [facility] -- Pt has pt instruction sheet from OV that list Azirthromycin - Zpak 250 mg as the plan of treatment . RN called Dr Yetta Barre and he asked for pt information and drugstore information and he stated he will call in rx. RN provided info and that pt wanted rx called to [facility] [phone] -- RN called and left msg with pts roommate, [name] , [phone]) that Dr Yetta Barre will call in rx. Protocol(s) Used: Office Note Recommended Outcome per Protocol: Information Noted and Sent to Office Reason for Outcome: Caller information to office Care Advice:  ~ 07/13/2010 10:34:25PM Page 1 of 1 [id number] Initial call taken by: Margaret Pyle, CMA,  July 14, 2010 7:59 AM  Follow-up for Phone Call        sent to Dr Posey Rea in error. Follow-up by: Margaret Pyle, CMA,  July 15, 2010 8:31 AM

## 2010-10-29 NOTE — Letter (Signed)
Summary: Laqueta Carina MD/[facility]  Laqueta Carina MD/[facility]   Imported By: Lester Quitman 10/13/2010 07:07:29  _____________________________________________________________________  External Attachment:    Type:   Image     Comment:   External Document

## 2010-12-08 LAB — BASIC METABOLIC PANEL
BUN: 6 mg/dL (ref 6–23)
Chloride: 104 mEq/L (ref 96–112)
Glucose, Bld: 108 mg/dL — ABNORMAL HIGH (ref 70–99)
Potassium: 4 mEq/L (ref 3.5–5.1)

## 2010-12-08 LAB — URINALYSIS, ROUTINE W REFLEX MICROSCOPIC
Bilirubin Urine: NEGATIVE
Hgb urine dipstick: NEGATIVE
Ketones, ur: NEGATIVE mg/dL
Specific Gravity, Urine: 1.022 (ref 1.005–1.030)
Urobilinogen, UA: 0.2 mg/dL (ref 0.0–1.0)

## 2010-12-08 LAB — CBC
HCT: 42.9 % (ref 36.0–46.0)
MCH: 32.2 pg (ref 26.0–34.0)
MCHC: 34.7 g/dL (ref 30.0–36.0)
MCV: 92.8 fL (ref 78.0–100.0)
RDW: 13.2 % (ref 11.5–15.5)

## 2010-12-08 LAB — DIFFERENTIAL
Basophils Absolute: 0 10*3/uL (ref 0.0–0.1)
Basophils Relative: 0 % (ref 0–1)
Eosinophils Relative: 0 % (ref 0–5)
Monocytes Absolute: 0.4 10*3/uL (ref 0.1–1.0)

## 2010-12-08 LAB — URINE MICROSCOPIC-ADD ON

## 2010-12-08 LAB — GC/CHLAMYDIA PROBE AMP, GENITAL
Chlamydia, DNA Probe: NEGATIVE
GC Probe Amp, Genital: NEGATIVE

## 2010-12-08 LAB — WET PREP, GENITAL

## 2011-06-29 ENCOUNTER — Ambulatory Visit (INDEPENDENT_AMBULATORY_CARE_PROVIDER_SITE_OTHER): Payer: 59 | Admitting: Internal Medicine

## 2011-06-29 ENCOUNTER — Encounter: Payer: Self-pay | Admitting: Internal Medicine

## 2011-06-29 VITALS — BP 100/64 | HR 70 | Temp 98.5°F | Wt 108.0 lb

## 2011-06-29 DIAGNOSIS — L709 Acne, unspecified: Secondary | ICD-10-CM | POA: Insufficient documentation

## 2011-06-29 DIAGNOSIS — J019 Acute sinusitis, unspecified: Secondary | ICD-10-CM

## 2011-06-29 DIAGNOSIS — L708 Other acne: Secondary | ICD-10-CM

## 2011-06-29 MED ORDER — SULFAMETHOXAZOLE-TRIMETHOPRIM 800-160 MG PO TABS: 1.0000 | ORAL_TABLET | Freq: Two times a day (BID) | ORAL | Status: AC

## 2011-06-29 MED ORDER — CLINDAMYCIN-BENZOYL PER-CLEANS 1-5 % EX KIT: 1.0000 | PACK | Freq: Two times a day (BID) | CUTANEOUS | Status: AC

## 2011-06-29 NOTE — Patient Instructions (Signed)
Acne Acne is a common skin problem. Up to 80% of people get acne at some time, especially from ages 27 years to 24 years. Acne occurs when oil glands get blocked, become red (inflamed) or infected.  CAUSES Hair follicles have glands that make an oily material called sebum. Acne happens when these glands get plugged with sebum and skin cells. Germs (bacteria) that are normally found in the oil glands can multiply. The main cause of acne is the change in hormones during adolescence. This causes the oil glands to get bigger and to make more sebum.  Other causes or things that can make acne worse include:  Hormone changes with women's menstrual cycles.   Oil based cosmetics and hair products.   Harshly scrubbing the skin.   Strong soaps or astringents.   Stress.   Heredity.   Hormone problems due to certain diseases.   Long or oily hair rubbing against the skin.   Some medicines.   Pressure from headbands, backpacks, or shoulder pads.   Job exposure to certain oils and chemicals.  SYMPTOMS Acne often occurs where there are concentrations of oil glands, such as the face, neck, chest, and upper back. Symptoms often include:  Red pimples.   Whiteheads.   Blackheads.   Small pus-filled pimples (pustules).   Bigger red pimples or pustules that cause tenderness.  More severe acne can cause:  Boils.   Fluid filled swellings (cysts).   Scars.  HOME CARE INSTRUCTIONS Acne usually disappears with time. Good skin care is the most important part of treatment:  Wash the skin gently at least twice a day and after exercise. Always wash your skin before bed.   Use mild soap.   After each wash, apply a non-oil based skin moisturizer. (Especially if you have dry skin).   Keep hair clean and off the face, and shampoo it daily.   Only use treatments prescribed by your caregiver.   Use a good sun block (SPF over 15). This is especially important when you use acne medicines.   Be  patient with treatments. It can take 2 months before acne gets better.   Use cosmetics that are noncomedogenic. This means that they do not plug the oil glands.   Avoid things that make acne worse:   Leaning your chin or forehead on your hands.   Picking or squeezing pimples.  TREATMENT There are many good treatments for acne. Some are available over-the-counter and some are available with a prescription. Treatment depends on:  The type of acne, such as red pimples or whiteheads.   How severe the acne is.  Common treatments include:  Creams and lotions that:   Prevent clogging of oil glands.   Treat or prevent infection and inflammation.   Antibiotics, put on the skin or taken as a pill.   Pills that decrease sebum production.   Birth control pills.   Special lights or lasers.   Minor surgery.   Injection of medicine into acne areas.   Chemicals that cause peeling of the skin.  SEEK MEDICAL CARE IF:  Acne is not better in 8 weeks.   Acne is worse.   A larger area of skin that is red or tender (may mean infection).  Document Released: 09/10/2000 Document Re-Released: 03/03/2010 [facility] Patient Information 2011 [place], [place].Sinusitis Sinuses are air pockets within the bones of your face. The growth of bacteria within a sinus leads to infection. Infection keeps the sinuses from draining. This infection is called sinusitis.  SYMPTOMS There will be different areas of pain depending on which sinuses have become infected.  The maxillary sinuses often produce pain beneath the eyes.   Frontal sinusitis may cause pain in the middle of the forehead and above the eyes.  Other problems (symptoms) include:  Toothaches.   Colored, pus-like (purulent) drainage from the nose.   Any swelling, warmth, or tenderness over the sinus areas may be signs of infection.  TREATMENT Sinusitis is most often determined by an exam and you may have x-rays taken. If x-rays have been  taken, make sure you obtain your results. Or find out how you are to obtain them. Your caregiver may give you medications (antibiotics). These are medications that will help kill the infection. You may also be given a medication (decongestant) that helps to reduce sinus swelling.  HOME CARE INSTRUCTIONS  Only take over-the-counter or prescription medicines for pain, discomfort, or fever as directed by your caregiver.   Drink extra fluids. Fluids help thin the mucus so your sinuses can drain more easily.   Applying either moist heat or ice packs to the sinus areas may help relieve discomfort.   Use saline nasal sprays to help moisten your sinuses. The sprays can be found at your local drugstore.  SEEK IMMEDIATE MEDICAL CARE IF YOU DEVELOP:  High fever that is still present after two days of antibiotic treatment.   Increasing pain, severe headaches, or toothache.   Nausea, vomiting, or drowsiness.   Unusual swelling around the face or trouble seeing.  MAKE SURE YOU:   Understand these instructions.   Will watch your condition.   Will get help right away if you are not doing well or get worse.  Document Released: 09/13/2005 Document Re-Released: 08/26/2008 [facility] Patient Information 2011 [place], [place].

## 2011-06-30 ENCOUNTER — Encounter: Payer: Self-pay | Admitting: Internal Medicine

## 2011-06-30 NOTE — Assessment & Plan Note (Signed)
Start bactrim and use DUAC

## 2011-06-30 NOTE — Assessment & Plan Note (Signed)
Start bactrim

## 2011-06-30 NOTE — Progress Notes (Signed)
  Subjective:    Patient ID: Gina Stephenson, female    DOB: 02-12-1991, 20 y.o.   MRN: [id number]  Sinusitis This is a new problem. The current episode started 1 to 4 weeks ago. The problem has been gradually worsening since onset. There has been no fever. Her pain is at a severity of 0/10. She is experiencing no pain. Associated symptoms include congestion, sinus pressure and a sore throat. Pertinent negatives include no chills, coughing, diaphoresis, ear pain, headaches, hoarse voice, neck pain, shortness of breath, sneezing or swollen glands. Past treatments include oral decongestants. The treatment provided no relief.      Review of Systems  Constitutional: Negative for fever, chills, diaphoresis, activity change, appetite change, fatigue and unexpected weight change.  HENT: Positive for congestion, sore throat, rhinorrhea, postnasal drip and sinus pressure. Negative for hearing loss, ear pain, nosebleeds, hoarse voice, facial swelling, sneezing, drooling, mouth sores, trouble swallowing, neck pain, dental problem, voice change, tinnitus and ear discharge.   Eyes: Negative.   Respiratory: Negative for cough, choking, chest tightness, shortness of breath, wheezing and stridor.   Cardiovascular: Negative for chest pain, palpitations and leg swelling.  Gastrointestinal: Negative.   Genitourinary: Negative.   Musculoskeletal: Negative.   Skin: Positive for rash (acne on her chin). Negative for color change, pallor and wound.  Neurological: Negative for dizziness, tremors, seizures, syncope, facial asymmetry, speech difficulty, weakness, light-headedness, numbness and headaches.       Objective:   Physical Exam  Vitals reviewed. Constitutional: She appears well-developed and well-nourished. No distress.  HENT:  Head: Normocephalic and atraumatic. No trismus in the jaw.  Right Ear: Hearing, tympanic membrane, external ear and ear canal normal.  Left Ear: Hearing, tympanic membrane, external  ear and ear canal normal.  Nose: Mucosal edema and rhinorrhea present. No sinus tenderness. No epistaxis.  No foreign bodies. Right sinus exhibits maxillary sinus tenderness. Right sinus exhibits no frontal sinus tenderness. Left sinus exhibits maxillary sinus tenderness. Left sinus exhibits no frontal sinus tenderness.  Mouth/Throat: Uvula is midline, oropharynx is clear and moist and mucous membranes are normal. Mucous membranes are not pale, not dry and not cyanotic. No oral lesions. No uvula swelling. No oropharyngeal exudate, posterior oropharyngeal edema, posterior oropharyngeal erythema or tonsillar abscesses.  Eyes: Conjunctivae and EOM are normal. Pupils are equal, round, and reactive to light. Right eye exhibits no discharge. Left eye exhibits no discharge. No scleral icterus.  Neck: Normal range of motion. Neck supple. No JVD present. No tracheal deviation present. No thyromegaly present.  Pulmonary/Chest: No stridor.  Lymphadenopathy:    She has no cervical adenopathy.  Skin: Skin is warm and dry. Rash noted. No abrasion, no bruising, no burn, no ecchymosis, no laceration, no lesion, no petechiae and no purpura noted. Rash is nodular. Rash is not macular, not papular, not pustular and not vesicular. She is not diaphoretic. No cyanosis or erythema. No pallor. Nails show no clubbing.          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

## 2011-12-13 ENCOUNTER — Encounter: Payer: Self-pay | Admitting: Internal Medicine

## 2011-12-13 ENCOUNTER — Other Ambulatory Visit (INDEPENDENT_AMBULATORY_CARE_PROVIDER_SITE_OTHER): Payer: PRIVATE HEALTH INSURANCE

## 2011-12-13 ENCOUNTER — Ambulatory Visit (INDEPENDENT_AMBULATORY_CARE_PROVIDER_SITE_OTHER): Payer: PRIVATE HEALTH INSURANCE | Admitting: Internal Medicine

## 2011-12-13 VITALS — BP 118/70 | HR 76 | Temp 97.5°F | Resp 16 | Wt 112.0 lb

## 2011-12-13 DIAGNOSIS — R7309 Other abnormal glucose: Secondary | ICD-10-CM | POA: Insufficient documentation

## 2011-12-13 DIAGNOSIS — Z0000 Encounter for general adult medical examination without abnormal findings: Secondary | ICD-10-CM

## 2011-12-13 DIAGNOSIS — E785 Hyperlipidemia, unspecified: Secondary | ICD-10-CM

## 2011-12-13 LAB — COMPREHENSIVE METABOLIC PANEL
AST: 27 U/L (ref 0–37)
Albumin: 4.2 g/dL (ref 3.5–5.2)
Alkaline Phosphatase: 68 U/L (ref 39–117)
BUN: 17 mg/dL (ref 6–23)
Potassium: 4.2 mEq/L (ref 3.5–5.1)
Total Bilirubin: 0.3 mg/dL (ref 0.3–1.2)

## 2011-12-13 LAB — TSH: TSH: 0.44 u[IU]/mL (ref 0.35–5.50)

## 2011-12-13 LAB — CBC WITH DIFFERENTIAL/PLATELET
Basophils Relative: 0.7 % (ref 0.0–3.0)
Eosinophils Absolute: 0 10*3/uL (ref 0.0–0.7)
MCHC: 33.4 g/dL (ref 30.0–36.0)
MCV: 94.9 fl (ref 78.0–100.0)
Monocytes Absolute: 0.3 10*3/uL (ref 0.1–1.0)
Neutrophils Relative %: 58.7 % (ref 43.0–77.0)
Platelets: 151 10*3/uL (ref 150.0–400.0)
RBC: 4.33 Mil/uL (ref 3.87–5.11)

## 2011-12-13 LAB — LIPID PANEL
Cholesterol: 158 mg/dL (ref 0–200)
HDL: 68.7 mg/dL (ref >39.00)
Triglycerides: 37 mg/dL (ref 0.0–149.0)
VLDL: 7.4 mg/dL (ref 0.0–40.0)

## 2011-12-13 NOTE — Assessment & Plan Note (Signed)
Exam done, labs ordered, vaccines were updated, pt ed material was given 

## 2011-12-13 NOTE — Progress Notes (Signed)
  Subjective:    Patient ID: Gina Stephenson, female    DOB: 06/19/1991, 21 y.o.   MRN: [id number]  HPI  She returns for a physical and offers no complaints.  Review of Systems  Constitutional: Negative.   HENT: Negative.   Eyes: Negative.   Respiratory: Negative.   Cardiovascular: Negative.   Gastrointestinal: Negative.   Genitourinary: Negative.   Musculoskeletal: Negative.   Skin: Negative.   Neurological: Negative.   Hematological: Negative.   Psychiatric/Behavioral: Negative.        Objective:   Physical Exam  Vitals reviewed. Constitutional: She is oriented to person, place, and time. She appears well-developed and well-nourished. No distress.  HENT:  Head: Normocephalic and atraumatic.  Mouth/Throat: Oropharynx is clear and moist. No oropharyngeal exudate.  Eyes: Conjunctivae are normal. Right eye exhibits no discharge. Left eye exhibits no discharge. No scleral icterus.  Neck: Normal range of motion. Neck supple. No JVD present. No tracheal deviation present. No thyromegaly present.  Cardiovascular: Normal rate, regular rhythm, normal heart sounds and intact distal pulses.  Exam reveals no gallop and no friction rub.   No murmur heard. Pulmonary/Chest: Effort normal and breath sounds normal. No stridor. No respiratory distress. She has no wheezes. She has no rales. She exhibits no tenderness.  Abdominal: Soft. Bowel sounds are normal. She exhibits no distension and no mass. There is no tenderness. There is no rebound and no guarding.  Musculoskeletal: Normal range of motion. She exhibits no edema and no tenderness.  Lymphadenopathy:    She has no cervical adenopathy.  Neurological: She is oriented to person, place, and time.  Skin: Skin is warm and dry. No rash noted. She is not diaphoretic. No erythema. No pallor.  Psychiatric: She has a normal mood and affect. Her behavior is normal. Judgment and thought content normal.      Lab Results  Component Value Date   WBC  10.6* 08/13/2010   HGB 14.9 08/13/2010   HCT 42.9 08/13/2010   PLT 180 08/13/2010   GLUCOSE 108* 08/13/2010   ALT 18 08/13/2010   AST 21 08/13/2010   NA 141 08/13/2010   K 4.0 08/13/2010   CL 104 08/13/2010   CREATININE 0.83 08/13/2010   BUN 6 08/13/2010   CO2 25 08/13/2010      Assessment & Plan:

## 2011-12-13 NOTE — Patient Instructions (Signed)
Preventive Care for Adults, Female A healthy lifestyle and preventive care can promote health and wellness. Preventive health guidelines for women include the following key practices.  A routine yearly physical is a good way to check with your caregiver about your health and preventive screening. It is a chance to share any concerns and updates on your health, and to receive a thorough exam.   Visit your dentist for a routine exam and preventive care every 6 months. Brush your teeth twice a day and floss once a day. Good oral hygiene prevents tooth decay and gum disease.   The frequency of eye exams is based on your age, health, family medical history, use of contact lenses, and other factors. Follow your caregiver's recommendations for frequency of eye exams.   Eat a healthy diet. Foods like vegetables, fruits, whole grains, low-fat dairy products, and lean protein foods contain the nutrients you need without too many calories. Decrease your intake of foods high in solid fats, added sugars, and salt. Eat the right amount of calories for you.Get information about a proper diet from your caregiver, if necessary.   Regular physical exercise is one of the most important things you can do for your health. Most adults should get at least 150 minutes of moderate-intensity exercise (any activity that increases your heart rate and causes you to sweat) each week. In addition, most adults need muscle-strengthening exercises on 2 or more days a week.   Maintain a healthy weight. The body mass index (BMI) is a screening tool to identify possible weight problems. It provides an estimate of body fat based on height and weight. Your caregiver can help determine your BMI, and can help you achieve or maintain a healthy weight.For adults 20 years and older:   A BMI below 18.5 is considered underweight.   A BMI of 18.5 to 24.9 is normal.   A BMI of 25 to 29.9 is considered overweight.   A BMI of 30 and above is  considered obese.   Maintain normal blood lipids and cholesterol levels by exercising and minimizing your intake of saturated fat. Eat a balanced diet with plenty of fruit and vegetables. Blood tests for lipids and cholesterol should begin at age 20 and be repeated every 5 years. If your lipid or cholesterol levels are high, you are over 50, or you are at high risk for heart disease, you may need your cholesterol levels checked more frequently.Ongoing high lipid and cholesterol levels should be treated with medicines if diet and exercise are not effective.   If you smoke, find out from your caregiver how to quit. If you do not use tobacco, do not start.   If you are pregnant, do not drink alcohol. If you are breastfeeding, be very cautious about drinking alcohol. If you are not pregnant and choose to drink alcohol, do not exceed 1 drink per day. One drink is considered to be 12 ounces (355 mL) of beer, 5 ounces (148 mL) of wine, or 1.5 ounces (44 mL) of liquor.   Avoid use of street drugs. Do not share needles with anyone. Ask for help if you need support or instructions about stopping the use of drugs.   High blood pressure causes heart disease and increases the risk of stroke. Your blood pressure should be checked at least every 1 to 2 years. Ongoing high blood pressure should be treated with medicines if weight loss and exercise are not effective.   If you are 55 to 21   years old, ask your caregiver if you should take aspirin to prevent strokes.   Diabetes screening involves taking a blood sample to check your fasting blood sugar level. This should be done once every 3 years, after age 45, if you are within normal weight and without risk factors for diabetes. Testing should be considered at a younger age or be carried out more frequently if you are overweight and have at least 1 risk factor for diabetes.   Breast cancer screening is essential preventive care for women. You should practice "breast  self-awareness." This means understanding the normal appearance and feel of your breasts and may include breast self-examination. Any changes detected, no matter how small, should be reported to a caregiver. Women in their 20s and 30s should have a clinical breast exam (CBE) by a caregiver as part of a regular health exam every 1 to 3 years. After age 40, women should have a CBE every year. Starting at age 40, women should consider having a mammography (breast X-ray test) every year. Women who have a family history of breast cancer should talk to their caregiver about genetic screening. Women at a high risk of breast cancer should talk to their caregivers about having magnetic resonance imaging (MRI) and a mammography every year.   The Pap test is a screening test for cervical cancer. A Pap test can show cell changes on the cervix that might become cervical cancer if left untreated. A Pap test is a procedure in which cells are obtained and examined from the lower end of the uterus (cervix).   Women should have a Pap test starting at age 21.   Between ages 21 and 29, Pap tests should be repeated every 2 years.   Beginning at age 30, you should have a Pap test every 3 years as long as the past 3 Pap tests have been normal.   Some women have medical problems that increase the chance of getting cervical cancer. Talk to your caregiver about these problems. It is especially important to talk to your caregiver if a new problem develops soon after your last Pap test. In these cases, your caregiver may recommend more frequent screening and Pap tests.   The above recommendations are the same for women who have or have not gotten the vaccine for human papillomavirus (HPV).   If you had a hysterectomy for a problem that was not cancer or a condition that could lead to cancer, then you no longer need Pap tests. Even if you no longer need a Pap test, a regular exam is a good idea to make sure no other problems are  starting.   If you are between ages 65 and 70, and you have had normal Pap tests going back 10 years, you no longer need Pap tests. Even if you no longer need a Pap test, a regular exam is a good idea to make sure no other problems are starting.   If you have had past treatment for cervical cancer or a condition that could lead to cancer, you need Pap tests and screening for cancer for at least 20 years after your treatment.   If Pap tests have been discontinued, risk factors (such as a new sexual partner) need to be reassessed to determine if screening should be resumed.   The HPV test is an additional test that may be used for cervical cancer screening. The HPV test looks for the virus that can cause the cell changes on the cervix.   The cells collected during the Pap test can be tested for HPV. The HPV test could be used to screen women aged 30 years and older, and should be used in women of any age who have unclear Pap test results. After the age of 30, women should have HPV testing at the same frequency as a Pap test.   Colorectal cancer can be detected and often prevented. Most routine colorectal cancer screening begins at the age of 50 and continues through age 75. However, your caregiver may recommend screening at an earlier age if you have risk factors for colon cancer. On a yearly basis, your caregiver may provide home test kits to check for hidden blood in the stool. Use of a small camera at the end of a tube, to directly examine the colon (sigmoidoscopy or colonoscopy), can detect the earliest forms of colorectal cancer. Talk to your caregiver about this at age 50, when routine screening begins. Direct examination of the colon should be repeated every 5 to 10 years through age 75, unless early forms of pre-cancerous polyps or small growths are found.   Hepatitis C blood testing is recommended for all people born from 1945 through 1965 and any individual with known risks for hepatitis C.    Practice safe sex. Use condoms and avoid high-risk sexual practices to reduce the spread of sexually transmitted infections (STIs). STIs include gonorrhea, chlamydia, syphilis, trichomonas, herpes, HPV, and human immunodeficiency virus (HIV). Herpes, HIV, and HPV are viral illnesses that have no cure. They can result in disability, cancer, and death. Sexually active women aged 25 and younger should be checked for chlamydia. Older women with new or multiple partners should also be tested for chlamydia. Testing for other STIs is recommended if you are sexually active and at increased risk.   Osteoporosis is a disease in which the bones lose minerals and strength with aging. This can result in serious bone fractures. The risk of osteoporosis can be identified using a bone density scan. Women ages 65 and over and women at risk for fractures or osteoporosis should discuss screening with their caregivers. Ask your caregiver whether you should take a calcium supplement or vitamin D to reduce the rate of osteoporosis.   Menopause can be associated with physical symptoms and risks. Hormone replacement therapy is available to decrease symptoms and risks. You should talk to your caregiver about whether hormone replacement therapy is right for you.   Use sunscreen with sun protection factor (SPF) of 30 or more. Apply sunscreen liberally and repeatedly throughout the day. You should seek shade when your shadow is shorter than you. Protect yourself by wearing long sleeves, pants, a wide-brimmed hat, and sunglasses year round, whenever you are outdoors.   Once a month, do a whole body skin exam, using a mirror to look at the skin on your back. Notify your caregiver of new moles, moles that have irregular borders, moles that are larger than a pencil eraser, or moles that have changed in shape or color.   Stay current with required immunizations.   Influenza. You need a dose every fall (or winter). The composition of  the flu vaccine changes each year, so being vaccinated once is not enough.   Pneumococcal polysaccharide. You need 1 to 2 doses if you smoke cigarettes or if you have certain chronic medical conditions. You need 1 dose at age 65 (or older) if you have never been vaccinated.   Tetanus, diphtheria, pertussis (Tdap, Td). Get 1 dose of   Tdap vaccine if you are younger than age 65, are over 65 and have contact with an infant, are a healthcare worker, are pregnant, or simply want to be protected from whooping cough. After that, you need a Td booster dose every 10 years. Consult your caregiver if you have not had at least 3 tetanus and diphtheria-containing shots sometime in your life or have a deep or dirty wound.   HPV. You need this vaccine if you are a woman age 26 or younger. The vaccine is given in 3 doses over 6 months.   Measles, mumps, rubella (MMR). You need at least 1 dose of MMR if you were born in 1957 or later. You may also need a second dose.   Meningococcal. If you are age 19 to 21 and a first-year college student living in a residence hall, or have one of several medical conditions, you need to get vaccinated against meningococcal disease. You may also need additional booster doses.   Zoster (shingles). If you are age 60 or older, you should get this vaccine.   Varicella (chickenpox). If you have never had chickenpox or you were vaccinated but received only 1 dose, talk to your caregiver to find out if you need this vaccine.   Hepatitis A. You need this vaccine if you have a specific risk factor for hepatitis A virus infection or you simply wish to be protected from this disease. The vaccine is usually given as 2 doses, 6 to 18 months apart.   Hepatitis B. You need this vaccine if you have a specific risk factor for hepatitis B virus infection or you simply wish to be protected from this disease. The vaccine is given in 3 doses, usually over 6 months.  Preventive Services /  Frequency Ages 19 to 39  Blood pressure check.** / Every 1 to 2 years.   Lipid and cholesterol check.** / Every 5 years beginning at age 20.   Clinical breast exam.** / Every 3 years for women in their 20s and 30s.   Pap test.** / Every 2 years from ages 21 through 29. Every 3 years starting at age 30 through age 65 or 70 with a history of 3 consecutive normal Pap tests.   HPV screening.** / Every 3 years from ages 30 through ages 65 to 70 with a history of 3 consecutive normal Pap tests.   Hepatitis C blood test.** / For any individual with known risks for hepatitis C.   Skin self-exam. / Monthly.   Influenza immunization.** / Every year.   Pneumococcal polysaccharide immunization.** / 1 to 2 doses if you smoke cigarettes or if you have certain chronic medical conditions.   Tetanus, diphtheria, pertussis (Tdap, Td) immunization. / A one-time dose of Tdap vaccine. After that, you need a Td booster dose every 10 years.   HPV immunization. / 3 doses over 6 months, if you are 26 and younger.   Measles, mumps, rubella (MMR) immunization. / You need at least 1 dose of MMR if you were born in 1957 or later. You may also need a second dose.   Meningococcal immunization. / 1 dose if you are age 19 to 21 and a first-year college student living in a residence hall, or have one of several medical conditions, you need to get vaccinated against meningococcal disease. You may also need additional booster doses.   Varicella immunization.** / Consult your caregiver.   Hepatitis A immunization.** / Consult your caregiver. 2 doses, 6 to 18 months   apart.   Hepatitis B immunization.** / Consult your caregiver. 3 doses usually over 6 months.  Ages 40 to 64  Blood pressure check.** / Every 1 to 2 years.   Lipid and cholesterol check.** / Every 5 years beginning at age 20.   Clinical breast exam.** / Every year after age 40.   Mammogram.** / Every year beginning at age 40 and continuing for as  long as you are in good health. Consult with your caregiver.   Pap test.** / Every 3 years starting at age 30 through age 65 or 70 with a history of 3 consecutive normal Pap tests.   HPV screening.** / Every 3 years from ages 30 through ages 65 to 70 with a history of 3 consecutive normal Pap tests.   Fecal occult blood test (FOBT) of stool. / Every year beginning at age 50 and continuing until age 75. You may not need to do this test if you get a colonoscopy every 10 years.   Flexible sigmoidoscopy or colonoscopy.** / Every 5 years for a flexible sigmoidoscopy or every 10 years for a colonoscopy beginning at age 50 and continuing until age 75.   Hepatitis C blood test.** / For all people born from 1945 through 1965 and any individual with known risks for hepatitis C.   Skin self-exam. / Monthly.   Influenza immunization.** / Every year.   Pneumococcal polysaccharide immunization.** / 1 to 2 doses if you smoke cigarettes or if you have certain chronic medical conditions.   Tetanus, diphtheria, pertussis (Tdap, Td) immunization.** / A one-time dose of Tdap vaccine. After that, you need a Td booster dose every 10 years.   Measles, mumps, rubella (MMR) immunization. / You need at least 1 dose of MMR if you were born in 1957 or later. You may also need a second dose.   Varicella immunization.** / Consult your caregiver.   Meningococcal immunization.** / Consult your caregiver.   Hepatitis A immunization.** / Consult your caregiver. 2 doses, 6 to 18 months apart.   Hepatitis B immunization.** / Consult your caregiver. 3 doses, usually over 6 months.  Ages 65 and over  Blood pressure check.** / Every 1 to 2 years.   Lipid and cholesterol check.** / Every 5 years beginning at age 20.   Clinical breast exam.** / Every year after age 40.   Mammogram.** / Every year beginning at age 40 and continuing for as long as you are in good health. Consult with your caregiver.   Pap test.** /  Every 3 years starting at age 30 through age 65 or 70 with a 3 consecutive normal Pap tests. Testing can be stopped between 65 and 70 with 3 consecutive normal Pap tests and no abnormal Pap or HPV tests in the past 10 years.   HPV screening.** / Every 3 years from ages 30 through ages 65 or 70 with a history of 3 consecutive normal Pap tests. Testing can be stopped between 65 and 70 with 3 consecutive normal Pap tests and no abnormal Pap or HPV tests in the past 10 years.   Fecal occult blood test (FOBT) of stool. / Every year beginning at age 50 and continuing until age 75. You may not need to do this test if you get a colonoscopy every 10 years.   Flexible sigmoidoscopy or colonoscopy.** / Every 5 years for a flexible sigmoidoscopy or every 10 years for a colonoscopy beginning at age 50 and continuing until age 75.   Hepatitis   C blood test.** / For all people born from 1945 through 1965 and any individual with known risks for hepatitis C.   Osteoporosis screening.** / A one-time screening for women ages 65 and over and women at risk for fractures or osteoporosis.   Skin self-exam. / Monthly.   Influenza immunization.** / Every year.   Pneumococcal polysaccharide immunization.** / 1 dose at age 65 (or older) if you have never been vaccinated.   Tetanus, diphtheria, pertussis (Tdap, Td) immunization. / A one-time dose of Tdap vaccine if you are over 65 and have contact with an infant, are a healthcare worker, or simply want to be protected from whooping cough. After that, you need a Td booster dose every 10 years.   Varicella immunization.** / Consult your caregiver.   Meningococcal immunization.** / Consult your caregiver.   Hepatitis A immunization.** / Consult your caregiver. 2 doses, 6 to 18 months apart.   Hepatitis B immunization.** / Check with your caregiver. 3 doses, usually over 6 months.  ** Family history and personal history of risk and conditions may change your caregiver's  recommendations. Document Released: 11/09/2001 Document Revised: 09/02/2011 Document Reviewed: 02/08/2011 [employer] Patient Information 2012 [employer]. 

## 2011-12-13 NOTE — Assessment & Plan Note (Signed)
I will check her a1c today 

## 2012-08-15 ENCOUNTER — Ambulatory Visit (INDEPENDENT_AMBULATORY_CARE_PROVIDER_SITE_OTHER): Payer: PRIVATE HEALTH INSURANCE | Admitting: Internal Medicine

## 2012-08-15 ENCOUNTER — Encounter: Payer: Self-pay | Admitting: Internal Medicine

## 2012-08-15 VITALS — BP 104/68 | HR 64 | Temp 97.1°F | Ht 61.0 in | Wt 119.0 lb

## 2012-08-15 DIAGNOSIS — R05 Cough: Secondary | ICD-10-CM

## 2012-08-15 DIAGNOSIS — Z23 Encounter for immunization: Secondary | ICD-10-CM

## 2012-08-15 DIAGNOSIS — R059 Cough, unspecified: Secondary | ICD-10-CM

## 2012-08-15 DIAGNOSIS — J309 Allergic rhinitis, unspecified: Secondary | ICD-10-CM

## 2012-08-15 MED ORDER — FLUTICASONE PROPIONATE 50 MCG/ACT NA SUSP: 2.0000 | Freq: Every day | NASAL | Status: DC

## 2012-08-15 MED ORDER — CETIRIZINE HCL 10 MG PO TABS: 10.0000 mg | ORAL_TABLET | Freq: Every day | ORAL | Status: DC

## 2012-08-15 MED ORDER — HYDROCODONE-HOMATROPINE 5-1.5 MG/5ML PO SYRP: 5.0000 mL | ORAL_SOLUTION | Freq: Three times a day (TID) | ORAL | Status: DC | PRN

## 2012-08-15 NOTE — Progress Notes (Signed)
HPI  Pt presents to the clinic today with a 2 week history of sore throat on the left side. She also c/o of an unproductive cough.The sore throat is intermittent, worse and night, and is starting to keep her up at night. The pain is 4/10. She has taken benadryl but that has not helped. She denies fevers, chills, nausea, vomiting or body aches. She has not had any sick contacts.  Review of Systems    Past Medical History  Diagnosis Date  . Anemia   . Hyperlipidemia     Family History  Problem Relation Age of Onset  . Cancer Other     Prostate Cancer    History   Social History  . Marital Status: Single    Spouse Name: N/A    Number of Children: N/A  . Years of Education: N/A   Occupational History  . Not on file.   Social History Main Topics  . Smoking status: Never Smoker   . Smokeless tobacco: Not on file  . Alcohol Use: No  . Drug Use: No  . Sexually Active: Yes    Birth Control/ Protection: IUD   Other Topics Concern  . Not on file   Social History Narrative  . No narrative on file    No Known Allergies   Constitutional: Denies headache, fatigue, fever or abrupt weight changes.  HEENT:  Pt reports sore throat. Denies eye pain, pressure behind the eyes, facial pain, nasal congestion, eye redness, ear pain, ringing in the ears, wax buildup, runny nose or bloody nose. Respiratory: Positive cough. Denies difficulty breathing or shortness of breath.  Cardiovascular: Denies chest pain, chest tightness, palpitations or swelling in the hands or feet.   No other specific complaints in a complete review of systems (except as listed in HPI above).  Objective:    General: Appears her stated age, well developed, well nourished in NAD. HEENT: Head: normal shape and size; Eyes: sclera white, no icterus, conjunctiva pink, PERRLA and EOMs intact; Ears: Tm's gray and intact, normal light reflex; Nose: mucosa pink and moist, septum deviateddline; Throat/Mouth: + PND. Teeth  present, mucosa pink and moist,  no exudate noted, no lesions or ulcerations noted.  Neck: Neck supple, trachea midline. No massses, lumps or thyromegaly present.  Cardiovascular: Normal rate and rhythm. S1,S2 noted.  No murmur, rubs or gallops noted. No JVD or BLE edema. No carotid bruits noted. Pulmonary/Chest: Normal effort and positive vesicular breath sounds. No respiratory distress. No wheezes, rales or ronchi noted.      Assessment & Plan:   Allergic Rhinitis  Can use a Neti Pot which can be purchased from your local drug store. Flonase 2 sprays each nostril for 3 days and then as needed. Zyrtec 10 mg daily  RTC as needed or if symptoms persist.

## 2012-08-15 NOTE — Patient Instructions (Addendum)
Allergic Rhinitis  Allergic rhinitis is when the mucous membranes in the nose respond to allergens. Allergens are particles in the air that cause your body to have an allergic reaction. This causes you to release allergic antibodies. Through a chain of events, these eventually cause you to release histamine into the blood stream (hence the use of antihistamines). Although meant to be protective to the body, it is this release that causes your discomfort, such as frequent sneezing, congestion and an itchy runny nose.    CAUSES    The pollen allergens may come from grasses, trees, and weeds. This is seasonal allergic rhinitis, or "hay fever." Other allergens cause year-round allergic rhinitis (perennial allergic rhinitis) such as house dust mite allergen, pet dander and mold spores.    SYMPTOMS     Nasal stuffiness (congestion).   Runny, itchy nose with sneezing and tearing of the eyes.   There is often an itching of the mouth, eyes and ears.  It cannot be cured, but it can be controlled with medications.  DIAGNOSIS    If you are unable to determine the offending allergen, skin or blood testing may find it.  TREATMENT     Avoid the allergen.   Medications and allergy shots (immunotherapy) can help.   Hay fever may often be treated with antihistamines in pill or nasal spray forms. Antihistamines block the effects of histamine. There are over-the-counter medicines that may help with nasal congestion and swelling around the eyes. Check with your caregiver before taking or giving this medicine.  If the treatment above does not work, there are many new medications your caregiver can prescribe. Stronger medications may be used if initial measures are ineffective. Desensitizing injections can be used if medications and avoidance fails. Desensitization is when a patient is given ongoing shots until the body becomes less sensitive to the allergen. Make sure you follow up with your caregiver if problems continue.   SEEK MEDICAL CARE IF:     You develop fever (more than 100.5 F (38.1 C).   You develop a cough that does not stop easily (persistent).   You have shortness of breath.   You start wheezing.   Symptoms interfere with normal daily activities.  Document Released: 06/08/2001 Document Revised: 12/06/2011 Document Reviewed: 12/18/2008  [employer] Patient Information 2013 [employer].

## 2012-08-21 ENCOUNTER — Telehealth: Payer: Self-pay | Admitting: Internal Medicine

## 2012-08-21 DIAGNOSIS — J3089 Other allergic rhinitis: Secondary | ICD-10-CM

## 2012-08-21 MED ORDER — METHYLPREDNISOLONE 4 MG PO KIT: PACK | ORAL | Status: DC

## 2012-08-21 NOTE — Telephone Encounter (Signed)
Seen 11/19 by Nicki Reaper - diagnosed with allergic rhinitis. In the absence of fever, chills, enlarged lymph nodes or purulent drainage bacterial infection is unlikely and thus antibiotics may not do the trick. At her office visit she was not advised to use a decongestant.  First line of treatment will be generic sudafed (behind the counter) 30 mg two or three times a day.

## 2012-08-21 NOTE — Telephone Encounter (Signed)
Try a medrol dose pak 

## 2012-08-21 NOTE — Telephone Encounter (Signed)
Please read note below and advise.  

## 2012-08-21 NOTE — Telephone Encounter (Signed)
Caller: Reonna/Patient; Phone: [phone]; Reason for Call: Pt was into the office on Thursday 08/17/12 and dx with allergies.  Pt is taking Hydrocodone cough syrup; nasal spray and an antihistamine.  She is calling back to say the sinus pressure has maintained; she is afebrile but has a constant sinus headache with sore throat which she feels is clearly a sinus infection.  Pt requesting an antibiotic be called in.  Does not want another appoinment.

## 2012-08-21 NOTE — Telephone Encounter (Signed)
Called pt to let her know that her first line of treatment should be generic sudafed (behind the counter) 30 mg two or three times a day. Pt states she is not sleeping due to this and she is leaving for vacation on Tues. Is there anything else that can be done to help? Please advise.

## 2012-08-21 NOTE — Telephone Encounter (Signed)
Called pt and let her know that an rx was sent into her pharmacy.

## 2012-10-28 ENCOUNTER — Ambulatory Visit (INDEPENDENT_AMBULATORY_CARE_PROVIDER_SITE_OTHER): Payer: PRIVATE HEALTH INSURANCE | Admitting: Internal Medicine

## 2012-10-28 ENCOUNTER — Encounter: Payer: Self-pay | Admitting: Internal Medicine

## 2012-10-28 VITALS — BP 100/68 | HR 67 | Temp 97.8°F | Wt 120.0 lb

## 2012-10-28 DIAGNOSIS — J45909 Unspecified asthma, uncomplicated: Secondary | ICD-10-CM

## 2012-10-28 DIAGNOSIS — R079 Chest pain, unspecified: Secondary | ICD-10-CM

## 2012-10-28 NOTE — Patient Instructions (Addendum)
EKG is normal but there are minor ST-T changes of early repolarization. These are normal variants but could be mistaken for acute injury. This EKG should be available for comparison if  seen emergently.  Go to WebMD for information about Mitral Valve Prolapse. Triggers may include  stimulants such as decongestants, diet pills, nicotine, or caffeine (coffee, tea, cola, or chocolate) to excess and stress. Reflux of gastric acid may be asymptomatic as this may occur mainly during sleep.The triggers for reflux  include stress; the "aspirin family" ; alcohol; peppermint; and caffeine (coffee, tea, cola, and chocolate). The aspirin family would include aspirin and the nonsteroidal agents such as ibuprofen &  Naproxen. Tylenol would not cause reflux. If having symptoms ; food & drink should be avoided for @ least 2 hours before going to bed.

## 2012-10-28 NOTE — Progress Notes (Signed)
  Subjective:    Patient ID: Gina Stephenson, female    DOB: 09-04-1991, 22 y.o.   MRN: [id number]  HPI Chest symptoms began  3 weeks ago & recurrence 10/27/12 ; there was no specific activity , trauma, immobilization,or prolonged travel as a prelude or trigger for  the discomfort  . The pain discomfort is localized toepigastrium  & is described as dull and non  radiating . The pain lasts  Minutes- hours. The pain occurs at rest  & IMPROVES with exertion . It is NOT associated with nausea, sweating, dyspnea , or palpitations. The pain is aggravated by position change, specifically sitting up. Deep breathing or coughing do not aggravate the pain. No  relief with TUMS.  Past medical history/family history/social history were all reviewed and updated. Pertinent data : PGF MI in 40s            Review of Systems There is NO  associated  Dyspepsia, dysphagia, other  abdominal pain, unexplained weight loss, melena, or  rectal bleeding. There is NO associated cough, sputum production, hemoptysis , or wheezing. Edema is NOT present. Claudication rest calf  is not described. No associated dizziness and syncope are not described. There is no associated rash color change temperature change in the area of the pain.  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 Exercise encompasses 2.5 hours 5 times a week.      Objective:   Physical Exam Thin but healthy and well-nourished & in no acute distress. No carotid bruits are present.No neck pain distention present at 10 - 15 degrees. Thyroid normal to palpation. Heart rhythm and rate are normal with no significant murmurs or gallops. Mitral click suggested @ apex Chest is clear with no increased work of breathing. There is no evidence of aortic aneurysm or renal artery bruits. Abdomen firm with no organomegaly or masses. No HJR. No clubbing, cyanosis or edema present. Homan's negative Pedal pulses are intact . No ischemic skin  changes rashes  are present . Nails healthy with chronic fungal changes deformity. Alert and oriented. Strength, tone, DTRs reflexes normal             Assessment & Plan:  #1 epigastric discomfort in a phenomenally fif it younger individual.  #2 clinical apical click  #3 early repolarization ST-T changes. #4 history of reactive airways disease after exposure to FluMist; no evidence of active asthma Plan: Trial of OTC PPI. The significance of EKG changes in the auscultory changes were discussed. Her symptoms are most likely related to stress impact on a possible mitral prolapse.

## 2012-12-15 ENCOUNTER — Encounter: Payer: PRIVATE HEALTH INSURANCE | Admitting: Internal Medicine

## 2012-12-15 DIAGNOSIS — Z0289 Encounter for other administrative examinations: Secondary | ICD-10-CM

## 2013-02-07 ENCOUNTER — Encounter: Payer: Self-pay | Admitting: Internal Medicine

## 2013-02-07 ENCOUNTER — Telehealth: Payer: Self-pay | Admitting: *Deleted

## 2013-02-07 ENCOUNTER — Ambulatory Visit (INDEPENDENT_AMBULATORY_CARE_PROVIDER_SITE_OTHER): Payer: PRIVATE HEALTH INSURANCE | Admitting: Internal Medicine

## 2013-02-07 ENCOUNTER — Other Ambulatory Visit: Payer: Self-pay | Admitting: Internal Medicine

## 2013-02-07 VITALS — BP 110/68 | HR 68 | Temp 98.0°F

## 2013-02-07 DIAGNOSIS — B372 Candidiasis of skin and nail: Secondary | ICD-10-CM

## 2013-02-07 MED ORDER — KETOCONAZOLE-HYDROCORTISONE 2 & 1 % (CREAM) EX KIT: 1.0000 "application " | PACK | Freq: Two times a day (BID) | CUTANEOUS | Status: DC

## 2013-02-07 MED ORDER — KETOCONAZOLE 2 % EX CREA: TOPICAL_CREAM | Freq: Every day | CUTANEOUS | Status: DC

## 2013-02-07 NOTE — Telephone Encounter (Signed)
Changed to nizoral cream

## 2013-02-07 NOTE — Progress Notes (Signed)
  Subjective:    Patient ID: Gina Stephenson, female    DOB: 09-22-1991, 22 y.o.   MRN: [id number]  HPI  Pt presents to the clinic today with c/o a rash in her groin area. This started about 2 weeks ago shortly after she started going back to the gym. She has been sweating a lot and wearing tight clothing. She is unsure if she has a skin yeast infection. She has had them in the past. It is very irritating and burns at times. It does not itch. It has not drained. She denies fever, chills or body aches.  Review of Systems      Past Medical History  Diagnosis Date  . Anemia   . Hyperlipidemia     Current Outpatient Prescriptions  Medication Sig Dispense Refill  . levonorgestrel (MIRENA) 20 MCG/24HR IUD 1 each by Intrauterine route once.        Marland Kitchen Ketoconazole-Hydrocortisone 2 & 1 % (CREAM) KIT Apply 1 application topically 2 (two) times daily.  1 kit  1   No current facility-administered medications for this visit.    Allergies  Allergen Reactions  . Adhesive (Tape)     Localized swelling    Family History  Problem Relation Age of Onset  . Cancer Other     Prostate Cancer  . Heart attack Paternal Grandfather     in 51s    History   Social History  . Marital Status: Single    Spouse Name: N/A    Number of Children: N/A  . Years of Education: N/A   Occupational History  . Not on file.   Social History Main Topics  . Smoking status: Never Smoker   . Smokeless tobacco: Not on file  . Alcohol Use: No  . Drug Use: No  . Sexually Active: Yes    Birth Control/ Protection: IUD   Other Topics Concern  . Not on file   Social History Narrative  . No narrative on file     Constitutional: Denies fever, malaise, fatigue, headache or abrupt weight changes.  Skin: Pt reports rash in groin. Denies lesions or ulcercations.    No other specific complaints in a complete review of systems (except as listed in HPI above).  Objective:   Physical Exam   BP 110/68  Pulse 68   Temp(Src) 98 F (36.7 C) (Oral)  SpO2 98% Wt Readings from Last 3 Encounters:  10/28/12 120 lb (54.432 kg)  08/15/12 119 lb (53.978 kg)  12/13/11 112 lb (50.803 kg)    General: Appears her stated age, well developed, well nourished in NAD. Skin: Warm, dry and intact. No lesions or ulcerations noted. Yeast infection of bilateral groins. Cardiovascular: Normal rate and rhythm. S1,S2 noted.  No murmur, rubs or gallops noted. No JVD or BLE edema. No carotid bruits noted. Pulmonary/Chest: Normal effort and positive vesicular breath sounds. No respiratory distress. No wheezes, rales or ronchi noted.        Assessment & Plan:   Yeast infection of skin, recurrent:  Allow the area to dry Wear cotton panties and stay away from constricting clothing eRx for Nizoral cream to area BID  RTC as needed or if symptoms persist

## 2013-02-07 NOTE — Patient Instructions (Signed)
Yeast Infection of the Skin Some yeast on the skin is normal, but sometimes it causes an infection. If you have a yeast infection, it shows up as white or light brown patches on brown skin. You can see it better in the summer on tan skin. It causes light-colored holes in your suntan. It can happen on any area of the body. This cannot be passed from person to person. HOME CARE  Scrub your skin daily with a dandruff shampoo. Your rash may take a couple weeks to get well.  Do not scratch or itch the rash. GET HELP RIGHT AWAY IF:   You get another infection from scratching. The skin may get warm, red, and may ooze fluid.  The infection does not seem to be getting better. MAKE SURE YOU:  Understand these instructions.  Will watch your condition.  Will get help right away if you are not doing well or get worse. Document Released: 08/26/2008 Document Revised: 12/06/2011 Document Reviewed: 08/26/2008 ExitCare Patient Information 2013 [employer].  

## 2013-02-07 NOTE — Telephone Encounter (Signed)
R'cd fax from [facility] stating that they are unable to order the [employer]. They want you to change drug.

## 2013-02-12 ENCOUNTER — Other Ambulatory Visit: Payer: Self-pay | Admitting: Internal Medicine

## 2013-02-12 ENCOUNTER — Telehealth: Payer: Self-pay | Admitting: Internal Medicine

## 2013-02-12 MED ORDER — TRIAMCINOLONE ACETONIDE 0.1 % EX CREA: TOPICAL_CREAM | Freq: Two times a day (BID) | CUTANEOUS | Status: DC

## 2013-02-12 NOTE — Telephone Encounter (Signed)
Pt informed of rx and NP's advisement.

## 2013-02-12 NOTE — Telephone Encounter (Signed)
Patient saw Rene Kocher for a rash and was told to call back in the medication was not helping, she is now requesting different medication but would like to speak with someone first

## 2013-02-12 NOTE — Telephone Encounter (Signed)
Will switch to triamcinolone cream. If that one is not effective, she should f/u with a dermatologist. She does not need a referral to make an appointment

## 2013-02-12 NOTE — Telephone Encounter (Signed)
Pt requesting different medication for rash/yeast infection of skin to pharmacy.

## 2015-11-06 ENCOUNTER — Encounter: Payer: Self-pay | Admitting: Sports Medicine

## 2015-11-06 ENCOUNTER — Ambulatory Visit (INDEPENDENT_AMBULATORY_CARE_PROVIDER_SITE_OTHER): Payer: PRIVATE HEALTH INSURANCE | Admitting: Sports Medicine

## 2015-11-06 VITALS — BP 122/84 | Ht 60.0 in | Wt 115.0 lb

## 2015-11-06 DIAGNOSIS — M25511 Pain in right shoulder: Secondary | ICD-10-CM | POA: Diagnosis not present

## 2015-11-06 NOTE — Progress Notes (Signed)
   Subjective:    Patient ID: Gina Stephenson, female    DOB: [birth year], 25 y.o.   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 She injured the shoulder in January 2015 when performing a clean and jerk. She had immediate pain in the right shoulder as well as the right side of her neck. She initially thought that she had injured her cervical spine so she sought treatment from a chiropractor but treatment did not help. She continued to struggle with shoulder pain, especially with lifting weights. She eventually sought treatment at a local orthopedist's office in November 2016. X-rays were obtained which were normal per the patient's report. Subacromial cortisone injection was administered which did not help her symptoms. She has also recently started physical therapy but has not noticed any benefit from that either. In addition to the pain that she gets with lifting weights, she also gets pain at night when sleeping on her right shoulder. When her shoulder gets really achy she has to splint at her side to get relief. Pain at times were radiate to her elbow but she denies any numbness or tingling. She has not noticed any weakness in her right shoulder or arm but does describe instability as well as painful catching and popping with certain maneuvers. Her pain is diffuse in the shoulder but at times will localizes itself to the posterior joint line. She denies any prior shoulder surgery. She has tried prescription strength ibuprofen with little benefit. She has aspirations to lift in the Olympics.  Past medical history reviewed Medications reviewed Allergies reviewed    Review of Systems    as above Objective:   Physical Exam  Well-developed, fit appearing. No acute distress. Awake alert and oriented 3. Vital signs reviewed.  Right shoulder: Full range of motion. No  tenderness to palpation along the clavicle or over the before meals joint. Rotator cuff strength is 5/5. No tenderness over the bicipital groove. Negative O'Brien. Positive clunk. Reproducible pain with axial loading and circumduction of the glenohumeral joint. No atrophy. Neurovascularly intact distally.  MSK ultrasound of the right shoulder was performed. Biceps tendon was well visualized and appears to be within normal limits. There are some slight hypoechoic changes in the leading edge of the supraspinatus tendon seen on both long and short views which suggest a small intrasubstance tear here. Subscapularis and infraspinatus appear to be within normal limits. Posterior joint line well-visualized. Posterior labrum appears to be unremarkable.       Assessment & Plan:  Chronic right shoulder pain worrisome for significant labral tear  Patient's symptoms have been present now for 2 years. They started after a traumatic event. She has failed conservative treatment to date including rest, oral anti-inflammatories, cortisone injections, and physical therapy. Her history and physical exam findings are concerning for a significant labral tear. Therefore, I will order an MRI arthrogram to further evaluate. I will call the patient with those results once available at which point we will delineate a more definitive treatment plan. Of note, I think the small intrasubstance tear in the supraspinatus seen on today's ultrasound is incidental.

## 2015-11-10 ENCOUNTER — Other Ambulatory Visit: Payer: PRIVATE HEALTH INSURANCE | Admitting: Sports Medicine

## 2015-11-19 ENCOUNTER — Other Ambulatory Visit: Payer: PRIVATE HEALTH INSURANCE

## 2015-11-19 ENCOUNTER — Ambulatory Visit: Payer: PRIVATE HEALTH INSURANCE | Admitting: Sports Medicine

## 2015-11-24 ENCOUNTER — Encounter: Payer: Self-pay | Admitting: Sports Medicine

## 2015-12-08 ENCOUNTER — Other Ambulatory Visit: Payer: Self-pay | Admitting: Internal Medicine

## 2016-11-17 LAB — HM PAP SMEAR

## 2017-01-25 HISTORY — PX: ROTATOR CUFF REPAIR: SHX139

## 2019-05-16 ENCOUNTER — Encounter: Payer: Self-pay | Admitting: Family Medicine

## 2019-05-16 ENCOUNTER — Ambulatory Visit (INDEPENDENT_AMBULATORY_CARE_PROVIDER_SITE_OTHER): Payer: PRIVATE HEALTH INSURANCE | Admitting: Family Medicine

## 2019-05-16 VITALS — BP 92/60 | Ht 61.0 in | Wt 114.6 lb

## 2019-05-16 DIAGNOSIS — G43009 Migraine without aura, not intractable, without status migrainosus: Secondary | ICD-10-CM

## 2019-05-16 DIAGNOSIS — G43909 Migraine, unspecified, not intractable, without status migrainosus: Secondary | ICD-10-CM

## 2019-05-16 DIAGNOSIS — F419 Anxiety disorder, unspecified: Secondary | ICD-10-CM

## 2019-05-16 HISTORY — DX: Migraine, unspecified, not intractable, without status migrainosus: G43.909

## 2019-05-16 MED ORDER — SERTRALINE HCL 25 MG PO TABS: ORAL_TABLET | ORAL | 1 refills | Status: DC

## 2019-05-16 NOTE — Progress Notes (Signed)
Virtual Visit via Video Note  Subjective  CC:  Chief Complaint  Patient presents with  . Anxiety    Has not had a PCP in years     I connected with Gina Stephenson on 05/17/19 at  4:00 PM EDT by a video enabled telemedicine application and verified that I am speaking with the correct person using two identifiers. Location patient: Home Location provider[place] [facility] [address] Persons participating in the virtual visit: Gina Stephenson, Willow Oraamille L , MD Rita Oharaiara Simmons, CMA  I discussed the limitations of evaluation and management by telemedicine and the availability of in person appointments. The patient expressed understanding and agreed to proceed. HPI: Gina Stephenson is a 28 y.o. female who was contacted today to address the problems listed above in the chief complaint. Gina Stephenson is struggling a bit. Seeing a therapist who is concerned: very anxious, not good showing feelings, limited relationships, hard to trust people, OCD tendencies, fear of being wrong and reports poor relationship with her parents. Doesn't like to be touched. Did ok in school but not great. dxd with dyslexia as teen. Says mother and father were very critical/often berating her. She has no memories of physical abuse but reports she has few memories of childhood. Didn't feel safe at home. Can't trust people. No depression but she is struggling with her constant worry. Denies current panic attacks. Never has been dxd with mood disorder.  . Otherwise extremely healthy. Weight lifter.  Assessment  1. Anxiety   2. Migraine without aura and without status migrainosus, not intractable      Plan   anxiety:  Today's visit was 55 minutes long. Greater than 50% of this time was devoted to face to face counseling with the patient and coordination of care. We discussed her diagnosis, prognosis, treatment options and treatment plan is documented below.   Need further eval: ? GAD,SAD, ASD, PTSD or other.  Since she is struggling a bit, I recommend a psych eval for input, then consider neuropsych eval if needed. Start zoloft for anxiety with close f/u.  I discussed the assessment and treatment plan with the patient. The patient was provided an opportunity to ask questions and all were answered. The patient agreed with the plan and demonstrated an understanding of the instructions.   The patient was advised to call back or seek an in-person evaluation if the symptoms worsen or if the condition fails to improve as anticipated. Follow up: Return in about 8 weeks (around 07/11/2019) for mood follow up.  Visit date not found  Meds ordered this encounter  Medications  . sertraline (ZOLOFT) 25 MG tablet    Sig: Take 1 tablet (25 mg total) by mouth daily for 14 days, THEN 2 tablets (50 mg total) daily for 14 days.    Dispense:  42 tablet    Refill:  1      I reviewed the patients updated PMH, FH, and SocHx.    Patient Active Problem List   Diagnosis Date Noted  . Migraine 05/16/2019  . Acne 06/29/2011  . Allergic rhinitis due to other allergen 08/03/2010  . Childhood asthma 10/27/2009   Current Meds  Medication Sig  . levonorgestrel (MIRENA, 52 MG,) 20 MCG/24HR IUD 1 Intra Uterine Device (1 each total) by Intrauterine route once.    Allergies: Patient is allergic to adhesive [tape]. Family History: Patient family history includes Healthy in her father; Heart attack in her paternal grandfather; Hyperlipidemia in her  paternal grandfather; Prostate cancer in an other family member. Social History:  Patient  reports that she has never smoked. She has never used smokeless tobacco. She reports that she does not drink alcohol or use drugs.  Review of Systems: Constitutional: Negative for fever malaise or anorexia Cardiovascular: negative for chest pain Respiratory: negative for SOB or persistent cough Gastrointestinal: negative for abdominal pain  OBJECTIVE Vitals: BP 92/60   Ht 5\' 1"   (1.549 m)   Wt 114 lb 10.2 oz (52 kg)   BMI 21.66 kg/m  General: no acute distress , A&Ox3tearful at times  Leamon Arnt, MD

## 2019-07-02 ENCOUNTER — Ambulatory Visit: Payer: PRIVATE HEALTH INSURANCE | Admitting: Family Medicine

## 2019-09-03 ENCOUNTER — Ambulatory Visit: Payer: Self-pay

## 2019-09-03 ENCOUNTER — Ambulatory Visit (INDEPENDENT_AMBULATORY_CARE_PROVIDER_SITE_OTHER): Payer: 59 | Admitting: Family Medicine

## 2019-09-03 ENCOUNTER — Encounter: Payer: Self-pay | Admitting: Family Medicine

## 2019-09-03 ENCOUNTER — Other Ambulatory Visit: Payer: Self-pay

## 2019-09-03 VITALS — BP 104/62 | Ht 62.0 in | Wt 116.8 lb

## 2019-09-03 DIAGNOSIS — M25562 Pain in left knee: Secondary | ICD-10-CM

## 2019-09-03 DIAGNOSIS — M76892 Other specified enthesopathies of left lower limb, excluding foot: Secondary | ICD-10-CM | POA: Diagnosis not present

## 2019-09-03 MED ORDER — NITROGLYCERIN 0.2 MG/HR TD PT24
MEDICATED_PATCH | TRANSDERMAL | 1 refills | Status: DC
Start: 2019-09-03 — End: 2021-03-20

## 2019-09-03 NOTE — Assessment & Plan Note (Signed)
Patient with pain superior to left patella and confirmed quadricep tendinitis via ultrasound.  Pain more significant with resisted completed flexion underweight.  Advised to consider reducing weightbearing temporarily and work with PT as she progresses towards next weightlifting palpitation, take Aleve twice per day for the next 7 to 10 days.  Did offer cortisone shot which was declined.  Follow-up in 6 weeks

## 2019-09-03 NOTE — Patient Instructions (Addendum)
You have a quad tendinopathy. Take aleve 2 tabs twice a day with food for pain and inflammation for 7-10 days then as needed. Icing 15 minutes at a time 3-4 times a day. I would recommend dropping down to about 50% of your typical weight for squats, lunges for now but work with physical therapy on increasing this back to your goal. Consider nitro patches - call me if you want to go ahead with this. Follow up with me in 1 month to 6 weeks.  Nitroglycerin Protocol   Apply 1/4 nitroglycerin patch to affected area daily.  Change position of patch within the affected area every 24 hours.  You may experience a headache during the first 1-2 weeks of using the patch, these should subside.  If you experience headaches after beginning nitroglycerin patch treatment, you may take your preferred over the counter pain reliever.  Another side effect of the nitroglycerin patch is skin irritation or rash related to patch adhesive.  Please notify our office if you develop more severe headaches or rash, and stop the patch.  Tendon healing with nitroglycerin patch may require 12 to 24 weeks depending on the extent of injury.  Men should not use if taking Viagra, Cialis, or Levitra.   Do not use if you have migraines or rosacea.

## 2019-09-03 NOTE — Progress Notes (Signed)
    Subjective:  Gina Stephenson is a 28 y.o. female who presents to the [facility] today with a chief complaint of left knee pain.   HPI: Tendinitis of left quadriceps tendon Patient is a Midwife.  Left knee pain started after which she said was a particularly heavy day lifting.  She then said that she went hiking on [facility].  She wore minimalist shoes and felt like her foot was slightly unstable the whole time.  But describes no specific point of injury or new pain during this hike.  She said when she was back to the hotel room she started getting significant pain in her knee.  Since then she has been able to accomplish her workouts but has very significant pain when she loads heavy and often has trouble getting into her car afterwards.  She does not describe any instability and has had no swelling.  Patient with pain superior to left patella.  No skin changes.  Pain is a discomfort and soreness.  Worse after workouts, better with rest.   Objective:  Physical Exam: BP 104/62   Ht 5\' 2"  (1.575 m)   Wt 116 lb 13.5 oz (53 kg)   BMI 21.37 kg/m   Gen: NAD, physically fit, pleasant Pulm: NWOB, no cough MSK:  Left knee:  Good muscular development with good hip abductor and quad strength.  No TTP currently including quad, hamstring tendons and joint lines.  Strength 5/5 with flexion and extension. FROM.  Negative Apley and Thessaly, negative anterior and posterior drawers, no valgus or varus laxity. No erythema or significant swelling Skin: warm, dry Neuro: grossly normal, moves all extremities Psych: Normal affect and thought content  Right knee: No deformity, instability. FROM with 5/5 strength. No tenderness to palpation. NVI distally.  MSK u/s:  No effusion.  Quad tendon with increased thickness.  Calcific tendinopathy noted at insertion with neovascularity, minimal surrounding edema.  Patellar tendon normal without infrapatellar bursitis.  Medial and lateral menisci  appear normal without tear.  Assessment/Plan:  Tendinitis of left quadriceps tendon Patient with pain superior to left patella and confirmed quadricep tendinitis via ultrasound.  Advised to consider reducing weightlifting temporarily and work with PT as she progresses towards next weightlifting competition, take Aleve twice per day for the next 7 to 10 days.  Nitro patches discussed and will use with protocol - ok to use in and out of competition per [employer].  Follow-up in 6 weeks   Sherene Sires, DO FAMILY MEDICINE RESIDENT - PGY3 09/03/2019 11:00 AM

## 2019-09-04 NOTE — Telephone Encounter (Signed)
Yes, please let her know that is fine and send note stating therapeutic massage is recommend to help manage her chronic migraines and chronic quadricep tendonopathy.   Thanks!

## 2019-09-16 ENCOUNTER — Encounter: Payer: Self-pay | Admitting: Family Medicine

## 2020-01-05 ENCOUNTER — Encounter: Payer: Self-pay | Admitting: Family Medicine

## 2020-01-23 ENCOUNTER — Telehealth: Payer: Self-pay | Admitting: Family Medicine

## 2020-01-23 NOTE — Telephone Encounter (Signed)
Patient called in and needs to get an order for Typhoid injections sent to the pharmacy.

## 2020-01-24 NOTE — Telephone Encounter (Signed)
Rx Request 

## 2021-01-05 ENCOUNTER — Other Ambulatory Visit: Payer: Self-pay

## 2021-01-05 ENCOUNTER — Encounter: Payer: Self-pay | Admitting: Family Medicine

## 2021-01-05 DIAGNOSIS — Z91018 Allergy to other foods: Secondary | ICD-10-CM

## 2021-01-05 DIAGNOSIS — Z0000 Encounter for general adult medical examination without abnormal findings: Secondary | ICD-10-CM

## 2021-01-05 NOTE — Telephone Encounter (Signed)
Can order cbc, cmp and tsh and food allergy panel and get scheduled for CPE. thanks

## 2021-03-12 ENCOUNTER — Ambulatory Visit: Payer: 59 | Admitting: Sports Medicine

## 2021-03-12 ENCOUNTER — Ambulatory Visit
Admission: RE | Admit: 2021-03-12 | Discharge: 2021-03-12 | Disposition: A | Discharge status: Home / Self Care | Payer: 59 | Source: Ambulatory Visit | Attending: Sports Medicine | Admitting: Sports Medicine

## 2021-03-12 ENCOUNTER — Other Ambulatory Visit: Payer: Self-pay

## 2021-03-12 VITALS — BP 102/70 | Ht 61.0 in | Wt 123.0 lb

## 2021-03-12 DIAGNOSIS — M25572 Pain in left ankle and joints of left foot: Secondary | ICD-10-CM

## 2021-03-12 NOTE — Progress Notes (Addendum)
  Gina Stephenson - 30 y.o. 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 She noticed it when she was doing deep squats with heavy weights. It would feel like her ankle was getting stuck and could not go any deeper. It is mainly located in the anterior portion of her ankle. There has been minimal pain. She may also notice this discomfort with cleans but has not been looking for it. It does pop occasionally but it is not painful to pop. Her massage therapist thought that it may be a bone spur. She has never had trauma or surgery on that ankle. She is mainly here for concern about it effecting her performance or worsening her ankle later on.   Objective:  VS: BP:102/70  HR: bpm  TEMP: ( )  RESP:   HT:5\' 1"  (154.9 cm)   WT:123 lb (55.8 kg)  BMI:23.25 PHYSICAL EXAM:  Well appearing woman in NAD.  Left Ankle: No effusion, ecchymosis, or erythema. Nontender to palpation along the entire ankle.  FROM in plantar and dorsiflexion. 5/5 strength in plantarflexion, dorsiflexion, inversion, and eversion. Positive anterior drawer with sulcus sign.  Grade III talar tilt.   Left Ankle Limited Ultrasound: Talar dome visualized transversely across the entire anterior ankle without abnormalities.  Medial and lateral ankle visualized without effusion.  Synovium visualized with increased hyperechoic lining.   Impression: Possible synovial thickening at the ankle joint.   ASSESSMENT & PLAN:  Left Ankle Discomfort: Her exam demonstrated significant laxity of the lateral ankle ligaments as well as possible synovial thickening at the ankle joint. These could be the causes of her discomfort. We will get an xray of the joint to rule out any bony abnormaliy that could be the cause of her pathology. We will call her with the results once we get them. In the  meantime, we advise wearing a bodyhelix compression sleeve when she lifts to help with ankle stability. If she begins to have pain or increasing discomfort we could discuss getting an MRI to assess. She can follow up with [place] as needed.   Aurelio Jew, MS4   Patient seen and evaluated with the medical student.  I agree with the above plan of care.  Ultrasound today shows no obvious abnormality of the talar dome.  There does appear to be some thickening of the synovial lining in the anterior ankle joint which could suggest anterior ankle impingement.  I will also check  x-rays to rule out bony anterior ankle impingement.  Phone follow-up with those results when available.  In the meantime, we will try a body helix compression sleeve.  She has bilateral chronic ankle instability which I think may benefit from this type of sleeve.  If symptoms worsen and x-ray is unremarkable we may need to consider merits of further diagnostic imaging.  Addendum: X-rays are unremarkable.  No signs of bony impingement.

## 2021-03-18 ENCOUNTER — Other Ambulatory Visit: Payer: Self-pay | Admitting: *Deleted

## 2021-03-18 DIAGNOSIS — M25572 Pain in left ankle and joints of left foot: Secondary | ICD-10-CM

## 2021-03-20 ENCOUNTER — Other Ambulatory Visit (INDEPENDENT_AMBULATORY_CARE_PROVIDER_SITE_OTHER): Payer: 59

## 2021-03-20 ENCOUNTER — Encounter: Payer: Self-pay | Admitting: Family Medicine

## 2021-03-20 ENCOUNTER — Ambulatory Visit (INDEPENDENT_AMBULATORY_CARE_PROVIDER_SITE_OTHER): Payer: 59 | Admitting: Family Medicine

## 2021-03-20 ENCOUNTER — Other Ambulatory Visit: Payer: Self-pay

## 2021-03-20 VITALS — BP 105/71 | HR 77 | Temp 98.8°F | Ht 61.0 in | Wt 126.2 lb

## 2021-03-20 DIAGNOSIS — Z975 Presence of (intrauterine) contraceptive device: Secondary | ICD-10-CM | POA: Insufficient documentation

## 2021-03-20 DIAGNOSIS — Z0000 Encounter for general adult medical examination without abnormal findings: Secondary | ICD-10-CM

## 2021-03-20 DIAGNOSIS — F840 Autistic disorder: Secondary | ICD-10-CM

## 2021-03-20 DIAGNOSIS — G43009 Migraine without aura, not intractable, without status migrainosus: Secondary | ICD-10-CM | POA: Diagnosis not present

## 2021-03-20 DIAGNOSIS — Z91018 Allergy to other foods: Secondary | ICD-10-CM

## 2021-03-20 LAB — VITAMIN B12: Vitamin B-12: 220 pg/mL (ref 211–911)

## 2021-03-20 LAB — COMPREHENSIVE METABOLIC PANEL
ALT: 17 U/L (ref 0–35)
AST: 17 U/L (ref 0–37)
Albumin: 4.7 g/dL (ref 3.5–5.2)
Alkaline Phosphatase: 42 U/L (ref 39–117)
BUN: 19 mg/dL (ref 6–23)
CO2: 26 mEq/L (ref 19–32)
Calcium: 9 mg/dL (ref 8.4–10.5)
Chloride: 105 mEq/L (ref 96–112)
Creatinine, Ser: 1.01 mg/dL (ref 0.40–1.20)
GFR: 74.77 mL/min (ref >60.00)
Glucose, Bld: 102 mg/dL — ABNORMAL HIGH (ref 70–99)
Potassium: 4 mEq/L (ref 3.5–5.1)
Sodium: 138 mEq/L (ref 135–145)
Total Bilirubin: 0.5 mg/dL (ref 0.2–1.2)
Total Protein: 6.8 g/dL (ref 6.0–8.3)

## 2021-03-20 LAB — CBC WITH DIFFERENTIAL/PLATELET
Basophils Absolute: 0 10*3/uL (ref 0.0–0.1)
Basophils Relative: 0.9 % (ref 0.0–3.0)
Eosinophils Absolute: 0 10*3/uL (ref 0.0–0.7)
Eosinophils Relative: 0.8 % (ref 0.0–5.0)
HCT: 41.8 % (ref 36.0–46.0)
Hemoglobin: 14.4 g/dL (ref 12.0–15.0)
Lymphocytes Relative: 36.5 % (ref 12.0–46.0)
Lymphs Abs: 1.8 10*3/uL (ref 0.7–4.0)
MCHC: 34.4 g/dL (ref 30.0–36.0)
MCV: 93.9 fl (ref 78.0–100.0)
Monocytes Absolute: 0.3 10*3/uL (ref 0.1–1.0)
Monocytes Relative: 6.5 % (ref 3.0–12.0)
Neutro Abs: 2.7 10*3/uL (ref 1.4–7.7)
Neutrophils Relative %: 55.3 % (ref 43.0–77.0)
Platelets: 175 10*3/uL (ref 150.0–400.0)
RBC: 4.45 Mil/uL (ref 3.87–5.11)
RDW: 12.6 % (ref 11.5–15.5)
WBC: 4.8 10*3/uL (ref 4.0–10.5)

## 2021-03-20 LAB — LIPID PANEL
Cholesterol: 168 mg/dL (ref 0–200)
HDL: 62 mg/dL (ref >39.00)
LDL Cholesterol: 98 mg/dL (ref 0–99)
NonHDL: 106.01
Total CHOL/HDL Ratio: 3
Triglycerides: 42 mg/dL (ref 0.0–149.0)
VLDL: 8.4 mg/dL (ref 0.0–40.0)

## 2021-03-20 LAB — TSH: TSH: 1.07 u[IU]/mL (ref 0.35–4.50)

## 2021-03-20 LAB — VITAMIN D 25 HYDROXY (VIT D DEFICIENCY, FRACTURES): VITD: 27.12 ng/mL — ABNORMAL LOW (ref 30.00–100.00)

## 2021-03-20 NOTE — Progress Notes (Signed)
Subjective  Chief Complaint  Patient presents with   Annual Exam    Not fasting   Migraine    HPI: Gina Stephenson is a 29 y.o. female who presents to [facility] [place] today for a Female Wellness Visit.  She also has the concerns and/or needs as listed above in the chief complaint. These will be addressed in addition to the Health Maintenance Visit.   Wellness Visit: annual visit with health maintenance review and exam without Pap  HM: sees gyn for female wellness. IUD in place with some breakthrough bleeding. Year # 5. Married. Monogamous. 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 Healthy diet and daily heavy exercise. Seeing SM for left ankle pain.  I reviewed notes. Migrinea Chronic disease management visit and/or acute problem visit: Migraines w/o aura; intermittent. Typically uses excedrin migraine or sleeps. Rare. No change in headaches. Allergy panel ordered due to her questioning if a food allergy could be a trigger.  ASD: had full eval several years back after our discussion. Copes well. Some anxiety symptoms; she manages behaviorally  Assessment  1. Annual physical exam   2. IUD (intrauterine device) in place   3. Migraine without aura and without status migrainosus, not intractable   4. Autism spectrum disorder      Plan  Female Wellness Visit: Age appropriate Health Maintenance and Prevention measures were discussed with patient. Included topics are cancer screening recommendations, ways to keep healthy (see AVS) including dietary and exercise recommendations, regular eye and dental care, use of seat belts, and avoidance of moderate alcohol use and tobacco use.  BMI: discussed patient's BMI and encouraged positive lifestyle modifications to help get to or maintain a target BMI. HM needs and immunizations were addressed and ordered. See below for orders. See HM and immunization section for updates. Routine labs and screening tests ordered including cmp, cbc and  lipids where appropriate. Discussed recommendations regarding Vit D and calcium supplementation (see AVS)  Chronic disease f/u and/or acute problem visit: (deemed necessary to be done in addition to the wellness visit): migraines:  check food allergy profile. Healthy lifestyle. Otc and behavioral mgt strategies are working. No change today ASD: will request evaluation notes IUD; likely BTB due to thin endometrium; can consider estrogen course. She will f/u with her GYN. Would like to defer childbearing until age 84.   Follow up: 57mofor cpe   Orders Placed This Encounter  Procedures   Lipid panel   VITAMIN D 25 Hydroxy (Vit-D Deficiency, Fractures)   Vitamin B12   Hepatitis C antibody   No orders of the defined types were placed in this encounter.      Body mass index is 23.85 kg/m. Wt Readings from Last 3 Encounters:  03/20/21 126 lb 3.2 oz (57.2 kg)  03/12/21 123 lb (55.8 kg)  09/03/19 116 lb 13.5 oz (53 kg)   Need for contraception: Yes, IUD  Patient Active Problem List   Diagnosis Date Noted   IUD (intrauterine device) in place 03/20/2021   Autism spectrum disorder 03/20/2021    Neuropsych eval     Migraine 05/16/2019   Acne 06/29/2011   Allergic rhinitis due to other allergen 08/03/2010   History of asthma - child 10/27/2009   Health Maintenance  Topic Date Due   Hepatitis C Screening  Never done   INFLUENZA VACCINE  04/27/2021   PAP SMEAR-Modifier  10/30/2023   TETANUS/TDAP  01/25/2030   COVID-19 Vaccine  Completed   HIV Screening  Completed  Pneumococcal Vaccine 62-57 Years old  Aged Out   HPV VACCINES  Aged Out   Immunization History  Administered Date(s) Administered   DTP 12/26/1990, 03/14/1991, 05/04/1991, 05/23/1992   Hepatitis A 03/28/2006, 10/10/2006   Hepatitis B 11/03/1992, 12/10/1992, 10/27/1993   Hpv-Unspecified 08/27/2009, 08/29/2009, 01/15/2010   Influenza Split 08/15/2012   Influenza Whole 06/23/2009, 07/29/2011    Influenza-Unspecified 07/08/2019   MMR 02/01/1992, 10/12/1995   Meningococcal Polysaccharide 11/09/2004   OPV 03/14/1991, 05/23/1992, 10/12/1995, 10/24/1997   PFIZER(Purple Top)SARS-COV-2 Vaccination 12/07/2019, 12/28/2019, 07/01/2020   Td 03/28/2006   Tdap 01/26/2020   We updated and reviewed the patient's past history in detail and it is documented below. Allergies: Patient  reports no history of alcohol use. Past Medical History Patient  has a past medical history of Anemia, Anxiety, Asthma, and Migraine (05/16/2019). Past Surgical History Patient  has a past surgical history that includes Rotator cuff repair (Right, 01/2017). Social History   Socioeconomic History   Marital status: Married    Spouse name: Tim   Number of children: Not on file   Years of education: Not on file   Highest education level: Not on file  Occupational History   Occupation: athlete, weight lifter   Occupation: Clinical research associate  Tobacco Use   Smoking status: Never   Smokeless tobacco: Never  Substance and Sexual Activity   Alcohol use: No   Drug use: No   Sexual activity: Yes    Birth control/protection: I.U.D.  Other Topics Concern   Not on file  Social History Narrative   Not on file   Social Determinants of Health   Financial Resource Strain: Not on file  Food Insecurity: Not on file  Transportation Needs: Not on file  Physical Activity: Not on file  Stress: Not on file  Social Connections: Not on file   Family History  Problem Relation Age of Onset   Prostate cancer Other    Heart attack Paternal Grandfather        in 90s   Hyperlipidemia Paternal Grandfather    Healthy Father     Review of Systems: Constitutional: negative for fever or malaise Ophthalmic: negative for photophobia, double vision or loss of vision Cardiovascular: negative for chest pain, dyspnea on exertion, or new LE swelling Respiratory: negative for SOB or persistent cough Gastrointestinal: negative for abdominal  pain, change in bowel habits or melena Genitourinary: negative for dysuria or gross hematuria, no abnormal uterine bleeding or disharge Musculoskeletal: negative for new gait disturbance or muscular weakness Integumentary: negative for new or persistent rashes, no breast lumps Neurological: negative for TIA or stroke symptoms Psychiatric: negative for SI or delusions Allergic/Immunologic: negative for hives  Patient Care Team    Relationship Specialty Notifications Start End  Leamon Arnt, MD PCP - General Family Medicine  05/16/19     Objective  Vitals: BP 105/71   Pulse 77   Temp 98.8 F (37.1 C) (Temporal)   Ht _0  (1.549 m)   Wt 126 lb 3.2 oz (57.2 kg)   SpO2 97%   BMI 23.85 kg/m  General:  Well developed, well nourished, no acute distress , muscular Psych:  Alert and orientedx3,normal mood and affect HEENT:  Normocephalic, atraumatic, non-icteric sclera, PERRL, supple neck without adenopathy, mass or thyromegaly Cardiovascular:  Normal S1, S2, RRR without gallop, rub or murmur Respiratory:  Good breath sounds bilaterally, CTAB with normal respiratory effort Gastrointestinal: normal bowel sounds, soft, non-tender, no noted masses. No HSM MSK: no deformities, contusions. Joints are without  erythema or swelling.  Skin:  Warm, no rashes or suspicious lesions noted Neurologic:    Mental status is normal. Gross motor and sensory exams are normal. Normal gait. No tremor    Commons side effects, risks, benefits, and alternatives for medications and treatment plan prescribed today were discussed, and the patient expressed understanding of the given instructions. Patient is instructed to call or message via MyChart if he/she has any questions or concerns regarding our treatment plan. No barriers to understanding were identified. We discussed Red Flag symptoms and signs in detail. Patient expressed understanding regarding what to do in case of urgent or emergency type symptoms.   Medication list was reconciled, printed and provided to the patient in AVS. Patient instructions and summary information was reviewed with the patient as documented in the AVS. This note was prepared with assistance of Dragon voice recognition software. Occasional wrong-word or sound-a-like substitutions may have occurred due to the inherent limitations of voice recognition software  This visit occurred during the SARS-CoV-2 public health emergency.  Safety protocols were in place, including screening questions prior to the visit, additional usage of staff PPE, and extensive cleaning of exam room while observing appropriate contact time as indicated for disinfecting solutions.

## 2021-03-20 NOTE — Patient Instructions (Signed)
Please return in 12 months for your annual complete physical; please come fasting.   I will release your lab results to you on your MyChart account with further instructions. Please reply with any questions.    If you have any questions or concerns, please don't hesitate to send me a message via MyChart or call the office at [phone]. Thank you for visiting with us today! It's our pleasure caring for you.    

## 2021-03-22 ENCOUNTER — Other Ambulatory Visit: Payer: 59

## 2021-03-23 ENCOUNTER — Other Ambulatory Visit: Payer: Self-pay | Admitting: *Deleted

## 2021-03-23 LAB — FOOD ALLERGY PROFILE
Allergen, Salmon, f41: <0.1 kU/L
Almonds: <0.1 kU/L
CLASS: 0
CLASS: 0
CLASS: 0
CLASS: 0
CLASS: 0
CLASS: 0
CLASS: 0
CLASS: 0
CLASS: 0
CLASS: 0
CLASS: 0
Cashew IgE: <0.1 kU/L
Class: 0
Class: 0
Class: 0
Class: 0
Egg White IgE: <0.1 kU/L
Fish Cod: <0.1 kU/L
Hazelnut: <0.1 kU/L
Milk IgE: <0.1 kU/L
Peanut IgE: <0.1 kU/L
Scallop IgE: <0.1 kU/L
Sesame Seed f10: <0.1 kU/L
Shrimp IgE: <0.1 kU/L
Soybean IgE: <0.1 kU/L
Tuna IgE: <0.1 kU/L
Walnut: <0.1 kU/L
Wheat IgE: <0.1 kU/L

## 2021-03-23 LAB — INTERPRETATION:

## 2021-03-23 LAB — HEPATITIS C ANTIBODY
Hepatitis C Ab: NONREACTIVE
SIGNAL TO CUT-OFF: 0 (ref <1.00)

## 2021-03-23 NOTE — Progress Notes (Signed)
[id number]  is the authorization for [id number] with valid dates being 6.24.2022 to 12.21.2022

## 2021-03-26 ENCOUNTER — Encounter: Payer: Self-pay | Admitting: Family Medicine

## 2021-04-02 ENCOUNTER — Ambulatory Visit
Admission: RE | Admit: 2021-04-02 | Discharge: 2021-04-02 | Disposition: A | Discharge status: Home / Self Care | Payer: 59 | Source: Ambulatory Visit | Attending: Sports Medicine | Admitting: Sports Medicine

## 2021-04-02 DIAGNOSIS — M25572 Pain in left ankle and joints of left foot: Secondary | ICD-10-CM

## 2021-04-05 ENCOUNTER — Other Ambulatory Visit: Payer: 59

## 2021-04-06 ENCOUNTER — Telehealth: Payer: Self-pay | Admitting: Sports Medicine

## 2021-04-06 ENCOUNTER — Encounter: Payer: Self-pay | Admitting: Family Medicine

## 2021-04-06 NOTE — Telephone Encounter (Signed)
I spoke with Gina Stephenson on the phone today after reviewing the MRI of her left ankle that we will [facility] she continues to have pain.  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  She also has a small 1.6 cm ganglion cyst arising from the sinus tarsi.  Otherwise the MRI is unremarkable.  I think the small tear in the peroneus brevis tendon is incidental.  Her symptoms may in fact be originating from the small ganglion cyst.  I recommended that she use some sort of bracing for stability when lifting.  She could choose between a compression sleeve or a lace up ASO.  I think she is okay to continue increasing activity as tolerated and follow-up with me as needed.

## 2021-05-11 ENCOUNTER — Encounter: Payer: Self-pay | Admitting: Family Medicine

## 2021-05-11 DIAGNOSIS — R7989 Other specified abnormal findings of blood chemistry: Secondary | ICD-10-CM

## 2021-06-02 NOTE — Telephone Encounter (Signed)
LVM for patient to call back and schedule a lab appt.  °

## 2021-06-24 ENCOUNTER — Encounter: Payer: Self-pay | Admitting: Family Medicine

## 2021-06-24 ENCOUNTER — Telehealth (INDEPENDENT_AMBULATORY_CARE_PROVIDER_SITE_OTHER): Payer: 59 | Admitting: Family Medicine

## 2021-06-24 DIAGNOSIS — E161 Other hypoglycemia: Secondary | ICD-10-CM

## 2021-06-24 DIAGNOSIS — G43109 Migraine with aura, not intractable, without status migrainosus: Secondary | ICD-10-CM | POA: Diagnosis not present

## 2021-06-24 MED ORDER — RIZATRIPTAN BENZOATE 10 MG PO TBDP: 10.0000 mg | ORAL_TABLET | ORAL | 1 refills | Status: DC | PRN

## 2021-06-24 MED ORDER — ONDANSETRON 4 MG PO TBDP: 4.0000 mg | ORAL_TABLET | Freq: Three times a day (TID) | ORAL | 0 refills | Status: DC | PRN

## 2021-06-24 NOTE — Progress Notes (Signed)
Virtual Visit via Video Note  Subjective  CC:  Chief Complaint  Patient presents with   Migraine    Discuss medication    I connected with Gina Stephenson on 06/24/21 at  4:15 PM EDT by a video enabled telemedicine application and verified that I am speaking with the correct person using two identifiers. Location patient: Home Location provider: [facility] [address] Office Persons participating in the virtual visit: Gina Stephenson, Willow Ora, MD Jolyne Loa CMA  I discussed the limitations of evaluation and management by telemedicine and the availability of in person appointments. The patient expressed understanding and agreed to proceed. HPI: Gina Stephenson is a 30 y.o. female who was contacted today to address the problems listed above in the chief complaint. 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 Has suffered from migraines for most of her life. Has used imitrex and topamax in past. Doesn't like side effects of both. Associated aura and n/v. Needs meds. No new sxs. Trying to find trigger; recently started with CGM and seeing reactive hypoglycemia with possible sxs of "feeling tired or off". Wondering if this triggers her migraines.   Assessment  1. Migraine with aura and without status migrainosus, not intractable   2. Reactive hypoglycemia      Plan  migraine:  educated. Needs abortive. Trial of maxalt and zofran. Can change if ineffective. No need for preventives yet. Will f/u if not controlled. Doubt hypoglycemia is trigger or pathologic.   I discussed the assessment and treatment plan with the patient. The patient was provided an opportunity to ask questions and all were answered. The patient agreed with the plan and demonstrated an understanding of the instructions.   The patient was advised to call back or seek an in-person evaluation if the symptoms worsen or if the condition fails to improve as anticipated. Follow  up: No follow-ups on file.  Visit date not found  Meds ordered this encounter  Medications   rizatriptan (MAXALT-MLT) 10 MG disintegrating tablet    Sig: Take 1 tablet (10 mg total) by mouth as needed for migraine. May repeat in 2 hours if needed    Dispense:  10 tablet    Refill:  1   ondansetron (ZOFRAN ODT) 4 MG disintegrating tablet    Sig: Take 1 tablet (4 mg total) by mouth every 8 (eight) hours as needed for nausea or vomiting.    Dispense:  20 tablet    Refill:  0      I reviewed the patients updated PMH, FH, and SocHx.    Patient Active Problem List   Diagnosis Date Noted   IUD (intrauterine device) in place 03/20/2021   Autism spectrum disorder 03/20/2021   Migraine 05/16/2019   Acne 06/29/2011   Allergic rhinitis due to other allergen 08/03/2010   History of asthma - child 10/27/2009   Current Meds  Medication Sig   levonorgestrel (MIRENA) 20 MCG/24HR IUD 1 Intra Uterine Device (1 each total) by Intrauterine route once.   ondansetron (ZOFRAN ODT) 4 MG disintegrating tablet Take 1 tablet (4 mg total) by mouth every 8 (eight) hours as needed for nausea or vomiting.   rizatriptan (MAXALT-MLT) 10 MG disintegrating tablet Take 1 tablet (10 mg total) by mouth as needed for migraine. May repeat in 2 hours if needed    Allergies: Patient is allergic to adhesive [tape]. Family History: Patient family history includes Healthy in her father; Heart attack  in her paternal grandfather; Hyperlipidemia in her paternal grandfather; Prostate cancer in an other family member. Social History:  Patient  reports that she has never smoked. She has never used smokeless tobacco. She reports that she does not drink alcohol and does not use drugs.  Review of Systems: Constitutional: Negative for fever malaise or anorexia Cardiovascular: negative for chest pain Respiratory: negative for SOB or persistent cough Gastrointestinal: negative for abdominal pain  OBJECTIVE Vitals: There were  no vitals taken for this visit. General: no acute distress , A&Ox3  Willow Ora, MD

## 2021-06-25 ENCOUNTER — Encounter: Payer: Self-pay | Admitting: Family Medicine

## 2021-07-02 LAB — LIPID PANEL
Cholesterol: 177 (ref 0–200)
HDL: 62 (ref 35–70)
LDL Cholesterol: 104
Triglycerides: 37 — AB (ref 40–160)

## 2021-07-02 LAB — BASIC METABOLIC PANEL: Glucose: 90

## 2021-07-03 ENCOUNTER — Encounter: Payer: Self-pay | Admitting: Family Medicine

## 2021-07-06 ENCOUNTER — Encounter (INDEPENDENT_AMBULATORY_CARE_PROVIDER_SITE_OTHER): Payer: Self-pay

## 2021-07-07 ENCOUNTER — Encounter: Payer: Self-pay | Admitting: Family Medicine

## 2021-08-09 ENCOUNTER — Encounter: Payer: Self-pay | Admitting: Family Medicine

## 2021-08-11 ENCOUNTER — Encounter: Payer: Self-pay | Admitting: Family Medicine

## 2021-08-15 ENCOUNTER — Encounter: Payer: Self-pay | Admitting: Family Medicine

## 2021-08-18 ENCOUNTER — Encounter: Payer: Self-pay | Admitting: Family Medicine

## 2021-10-19 ENCOUNTER — Encounter: Payer: Self-pay | Admitting: Family Medicine

## 2021-10-19 ENCOUNTER — Telehealth (INDEPENDENT_AMBULATORY_CARE_PROVIDER_SITE_OTHER): Payer: 59 | Admitting: Family Medicine

## 2021-10-19 DIAGNOSIS — J0100 Acute maxillary sinusitis, unspecified: Secondary | ICD-10-CM | POA: Diagnosis not present

## 2021-10-19 DIAGNOSIS — G43109 Migraine with aura, not intractable, without status migrainosus: Secondary | ICD-10-CM | POA: Diagnosis not present

## 2021-10-19 MED ORDER — AMOXICILLIN-POT CLAVULANATE 875-125 MG PO TABS: 1.0000 | ORAL_TABLET | Freq: Two times a day (BID) | ORAL | 0 refills | Status: AC

## 2021-10-19 MED ORDER — RIZATRIPTAN BENZOATE 10 MG PO TBDP: 10.0000 mg | ORAL_TABLET | ORAL | 2 refills | Status: DC | PRN

## 2021-10-19 NOTE — Progress Notes (Signed)
Virtual Visit via Video Note  Subjective  CC:  Chief Complaint  Patient presents with   Nasal Congestion    Present since 1/12   Headache     I connected with [name] on 10/19/21 at  4:00 PM EST by a video enabled telemedicine application and verified that I am speaking with the correct person using two identifiers. Location patient: Home Location provider: [facility] [address], Office Persons participating in the virtual visit: Maryagnes Carrasco, Willow Ora, MD Amedeo Gory CMA  I discussed the limitations of evaluation and management by telemedicine and the availability of in person appointments. The patient expressed understanding and agreed to proceed. HPI: Gina Stephenson is a 31 y.o. female who was contacted today to address the problems listed above in the chief complaint. Several week history of nasal congestion and postnasal drip.  Now with frontal headaches and pressure.  Had severe headache while flying last week.  Thick drainage.  Was blowing thick purulent blood-tinged mucus.  No fevers or chills.  Negative COVID.  Has been a long time since she has had a sinus infection but feels similar.  Tried Flonase without resolution. Migraines: Maxalt works well as an abortive agent.  Gets intermittent headaches.  No intractable headaches.  Did not tolerate Zofran.  Would like refill of Maxalt.  Assessment  1. Acute non-recurrent maxillary sinusitis   2. Migraine with aura and without status migrainosus, not intractable      Plan  Sinusitis: Advil, Mucinex, Augmentin twice daily for 10 days.  Education given. Migraine control is good.  Refill Maxalt for as needed abortive use. I discussed the assessment and treatment plan with the patient. The patient was provided an opportunity to ask questions and all were answered. The patient agreed with the plan and demonstrated an understanding of the instructions.   The patient was advised to call back or seek an  in-person evaluation if the symptoms worsen or if the condition fails to improve as anticipated. Follow up:Marland Kitchen  As needed Visit date not found  Meds ordered this encounter  Medications   amoxicillin-clavulanate (AUGMENTIN) 875-125 MG tablet    Sig: Take 1 tablet by mouth 2 (two) times daily for 10 days.    Dispense:  20 tablet    Refill:  0   rizatriptan (MAXALT-MLT) 10 MG disintegrating tablet    Sig: Take 1 tablet (10 mg total) by mouth as needed for migraine. May repeat in 2 hours if needed    Dispense:  10 tablet    Refill:  2      I reviewed the patients updated PMH, FH, and SocHx.    Patient Active Problem List   Diagnosis Date Noted   IUD (intrauterine device) in place 03/20/2021   Autism spectrum disorder 03/20/2021   Migraine 05/16/2019   Acne 06/29/2011   Allergic rhinitis due to other allergen 08/03/2010   History of asthma - child 10/27/2009   Current Meds  Medication Sig   amoxicillin-clavulanate (AUGMENTIN) 875-125 MG tablet Take 1 tablet by mouth 2 (two) times daily for 10 days.   levonorgestrel (MIRENA) 20 MCG/24HR IUD 1 Intra Uterine Device (1 each total) by Intrauterine route once.   [DISCONTINUED] rizatriptan (MAXALT-MLT) 10 MG disintegrating tablet Take 1 tablet (10 mg total) by mouth as needed for migraine. May repeat in 2 hours if needed    Allergies: Patient is allergic to adhesive [tape]. Family History: Patient family history includes Healthy in her  father; Heart attack in her paternal grandfather; Hyperlipidemia in her paternal grandfather; Prostate cancer in an other family member. Social History:  Patient  reports that she has never smoked. She has never used smokeless tobacco. She reports that she does not drink alcohol and does not use drugs.  Review of Systems: Constitutional: Negative for fever malaise or anorexia Cardiovascular: negative for chest pain Respiratory: negative for SOB or persistent cough Gastrointestinal: negative for  abdominal pain  OBJECTIVE Vitals: There were no vitals taken for this visit. General: no acute distress , A&Ox3  Willow Ora, MD

## 2021-10-21 ENCOUNTER — Telehealth: Payer: 59 | Admitting: Family Medicine

## 2021-12-09 ENCOUNTER — Encounter: Payer: Self-pay | Admitting: Family Medicine

## 2021-12-09 MED ORDER — ALBUTEROL SULFATE HFA 108 (90 BASE) MCG/ACT IN AERS: 2.0000 | INHALATION_SPRAY | RESPIRATORY_TRACT | 2 refills | Status: AC | PRN

## 2022-06-21 ENCOUNTER — Encounter: Payer: Self-pay | Admitting: *Deleted

## 2022-08-05 ENCOUNTER — Encounter: Payer: Self-pay | Admitting: Family Medicine

## 2022-09-02 ENCOUNTER — Encounter: Payer: Self-pay | Admitting: Family Medicine

## 2022-09-09 ENCOUNTER — Encounter: Payer: Self-pay | Admitting: *Deleted

## 2022-09-27 NOTE — L&D Delivery Note (Signed)
      Delivery Note:   G1P0 at [redacted]w[redacted]d  Admitting diagnosis: Normal labor [O80, Z37.9] Risks: Hx migraines, negative nero w/u prior to pregnancy Beta Thal minor, no anemia but low ferritin (13), on oral Fe supplement Maternal temp and tachy in labor, suspect chorio, tx one dose Unasyn 3 gm IVPB  First Stage:  Induction of labor:started 10 am for prodromal labor Onset of labor: 1100 04/08/2023 Augmentation: AROM and Pitocin ROM: 1147, clear fluid Active labor onset: 1300 Analgesia /Anesthesia/Pain control intrapartum: Epidural  Second Stage:  Complete dilation at 04/08/2023 2311 Onset of pushing at 2311 FHR second stage Category 2, variables   Pushing in R tilt position with CNM and L&D staff support, Sharla Kidney and Cordelia Pen present for birth and supportive. Nuchal Cord: No  Delivery of a Live born female  Birth Weight:  pending APGAR: 8, 9  Newborn Delivery   Birth date/time: 04/08/2023 23:41:45 Delivery type: Vaginal, Spontaneous     in cephalic presentation, position ROA to ROT. Shoulders and body delivered with ease. Meconium stained fluid noted prior to delivery and NICU team called in attendance. Newborn with spontaneous cry, vigorous tone, dried and stimulated, placed on maternal abdomen.  Cord double clamped after cessation of pulsation, cut by Tim.  Collection of cord blood for typing completed. Cord blood donation-None Arterial cord blood sample-Yes   Third Stage:  Placenta delivered 04/09/2023 at 2347-Spontaneous with 3 vessels. Bilobed placenta noted Uterine tone firm, bleeding small. Uterotonics: Pitocin IV bouls Placenta to L&D for disposal.  2nd degree;Perineal laceration identified.  Episiotomy:None Local analgesia: epidural  Repair:2.0 vicryl in standard fashion,  Skin tag removed at patient request from L buttock inferior to gluteal fold, skin re approximated with SQ stitch, good hemostasis. Est. Blood Loss (mL):200.00  Complications:  None   Mom to postpartum.  Baby Amelia to Couplet care / Skin to Skin.  Delivery Report:  Review the Delivery Report for details.     Signed: Neta Mends, CNM, MSN 04/09/2023, 12:22 AM

## 2022-10-04 IMAGING — DX DG ANKLE COMPLETE 3+V*L*
3 series · 3 of 3 positions shown · non-contrast
Comparison: None.

CLINICAL DATA: Left anterior ankle pain, no injury

EXAM:
LEFT ANKLE COMPLETE - 3+ VIEW

[dg ankle complete left (1 of 3)]
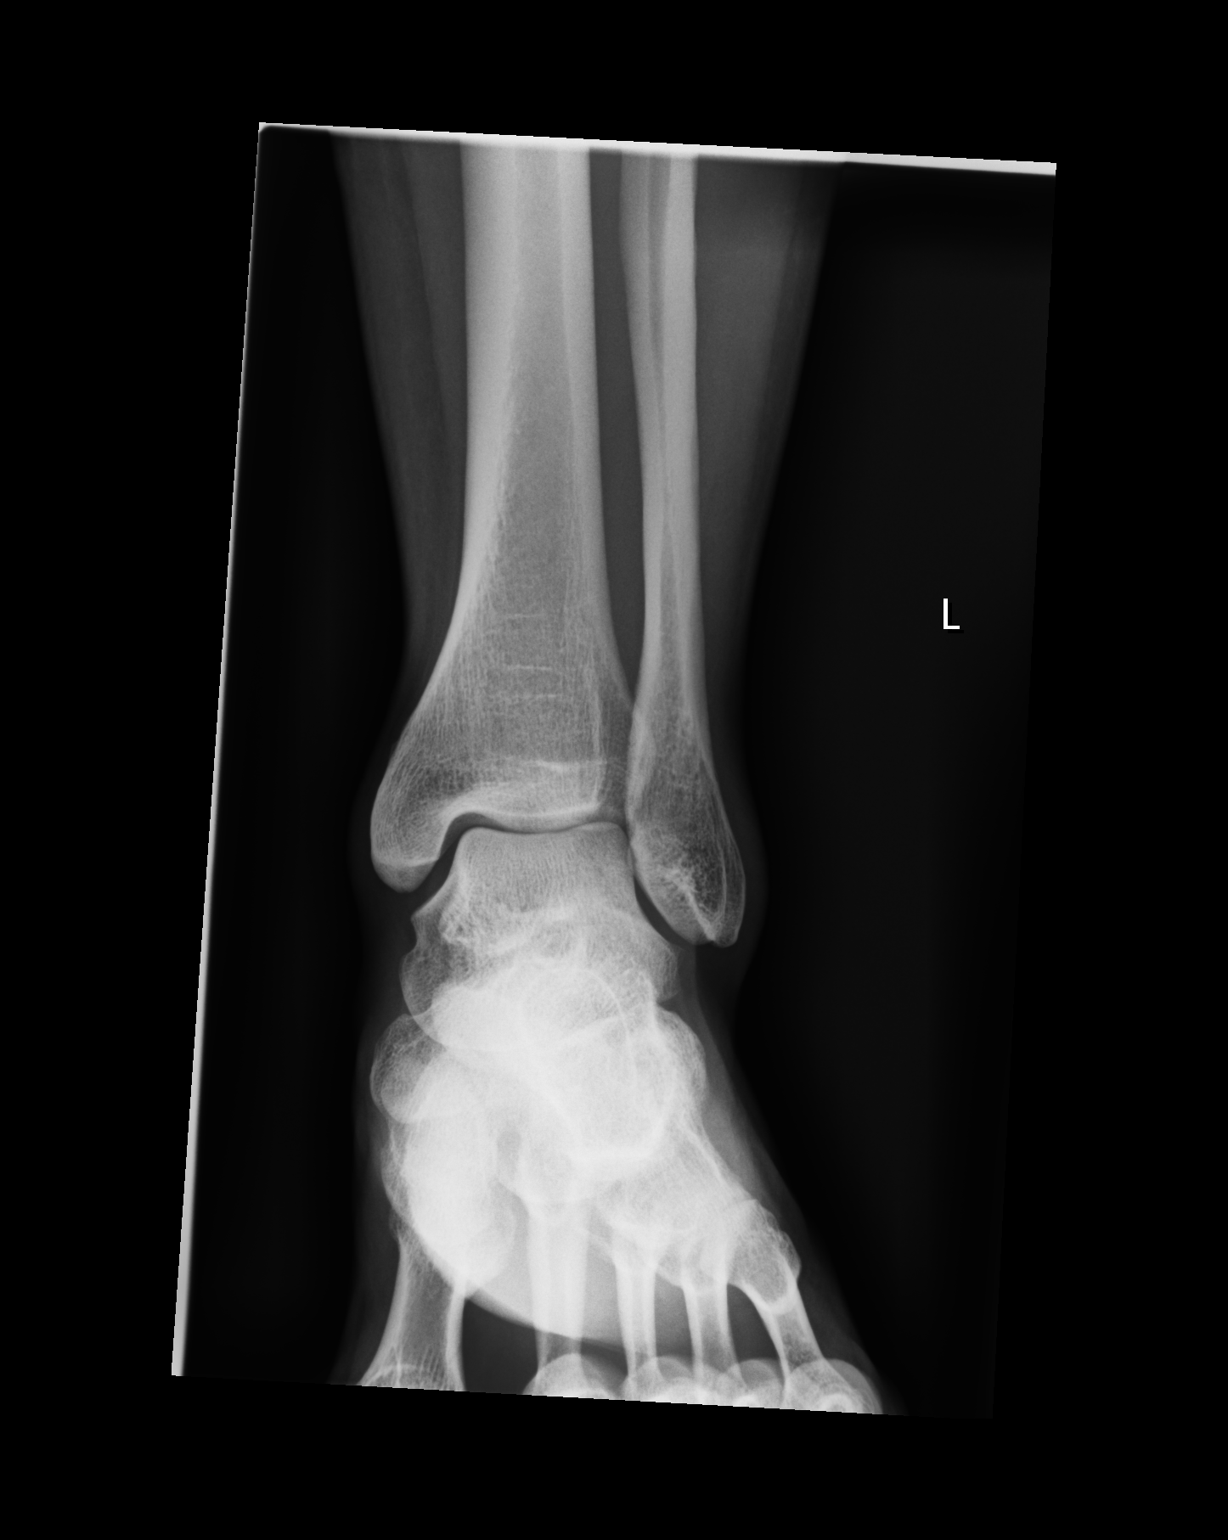

[dg ankle complete left (2 of 3)]
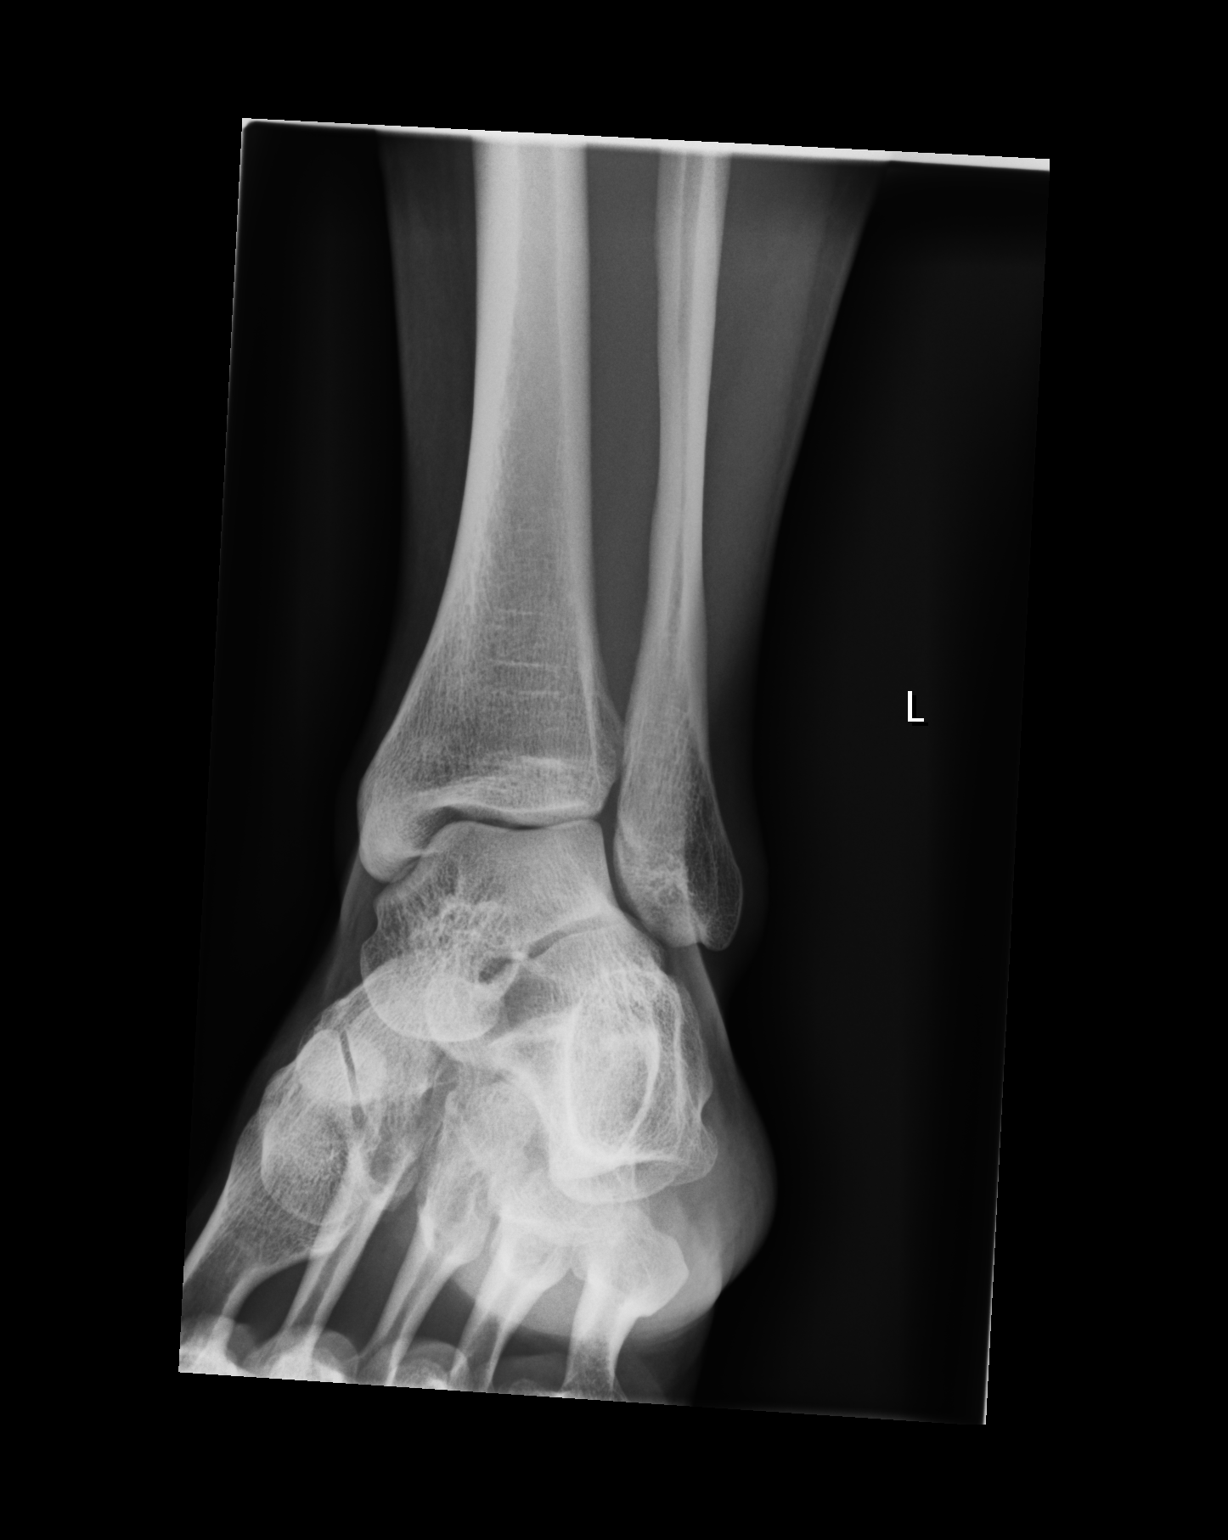

[dg ankle complete left (3 of 3)]
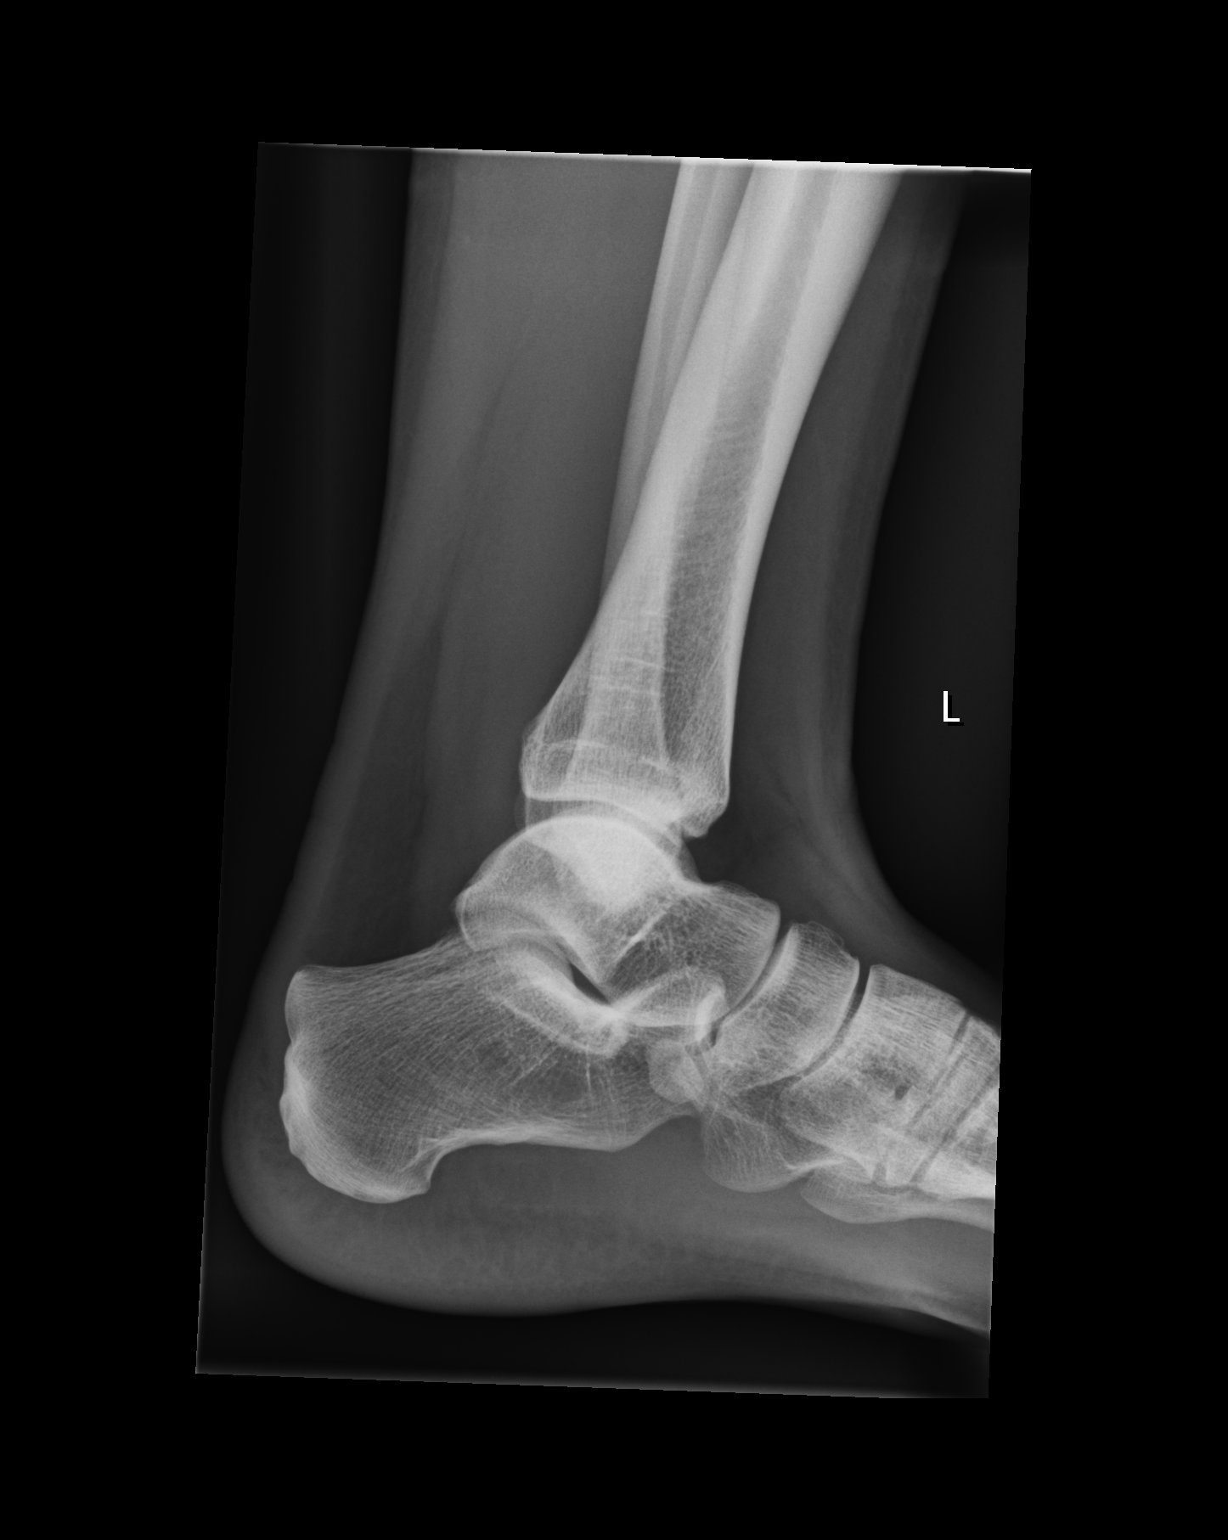

[3 of 3 positions shown; findings below may reference images not displayed]

FINDINGS: No fracture or dislocation is seen.

The ankle mortise is intact.

The base of the fifth metatarsal is unremarkable.

Visualized soft tissues are within normal limits.
IMPRESSION: Negative.

## 2022-10-20 LAB — OB RESULTS CONSOLE RPR: RPR: NONREACTIVE

## 2022-10-20 LAB — OB RESULTS CONSOLE GC/CHLAMYDIA
Chlamydia: NEGATIVE
Neisseria Gonorrhea: NEGATIVE

## 2022-10-20 LAB — OB RESULTS CONSOLE HEPATITIS B SURFACE ANTIGEN: Hepatitis B Surface Ag: NEGATIVE

## 2022-10-20 LAB — HEPATITIS C ANTIBODY: HCV Ab: NEGATIVE

## 2022-10-20 LAB — OB RESULTS CONSOLE HIV ANTIBODY (ROUTINE TESTING): HIV: NONREACTIVE

## 2022-10-20 LAB — OB RESULTS CONSOLE ANTIBODY SCREEN: Antibody Screen: NEGATIVE

## 2022-10-20 LAB — OB RESULTS CONSOLE RUBELLA ANTIBODY, IGM: Rubella: IMMUNE

## 2022-10-20 LAB — OB RESULTS CONSOLE ABO/RH: RH Type: POSITIVE

## 2022-11-24 ENCOUNTER — Ambulatory Visit: Payer: 59 | Admitting: Family Medicine

## 2023-01-12 ENCOUNTER — Other Ambulatory Visit: Payer: Self-pay | Admitting: Infectious Diseases

## 2023-01-12 DIAGNOSIS — R739 Hyperglycemia, unspecified: Secondary | ICD-10-CM

## 2023-01-12 MED ORDER — FREESTYLE LIBRE 2 SENSOR MISC: 1.0000 | 0 refills | Status: DC

## 2023-03-09 LAB — OB RESULTS CONSOLE GBS: GBS: NEGATIVE

## 2023-03-21 ENCOUNTER — Inpatient Hospital Stay: Payer: 59

## 2023-03-21 ENCOUNTER — Encounter: Payer: Self-pay | Admitting: Hematology

## 2023-03-21 ENCOUNTER — Inpatient Hospital Stay: Payer: 59 | Attending: Hematology | Admitting: Hematology

## 2023-03-21 ENCOUNTER — Other Ambulatory Visit: Payer: Self-pay

## 2023-03-21 ENCOUNTER — Ambulatory Visit: Payer: 59

## 2023-03-21 VITALS — BP 121/79 | HR 74 | Temp 98.3°F | Resp 20 | Wt 157.6 lb

## 2023-03-21 DIAGNOSIS — Z8349 Family history of other endocrine, nutritional and metabolic diseases: Secondary | ICD-10-CM | POA: Insufficient documentation

## 2023-03-21 DIAGNOSIS — J45909 Unspecified asthma, uncomplicated: Secondary | ICD-10-CM | POA: Diagnosis not present

## 2023-03-21 DIAGNOSIS — D582 Other hemoglobinopathies: Secondary | ICD-10-CM | POA: Diagnosis present

## 2023-03-21 DIAGNOSIS — Z8042 Family history of malignant neoplasm of prostate: Secondary | ICD-10-CM | POA: Insufficient documentation

## 2023-03-21 DIAGNOSIS — Z79899 Other long term (current) drug therapy: Secondary | ICD-10-CM | POA: Diagnosis not present

## 2023-03-21 DIAGNOSIS — R5383 Other fatigue: Secondary | ICD-10-CM | POA: Insufficient documentation

## 2023-03-21 DIAGNOSIS — Z148 Genetic carrier of other disease: Secondary | ICD-10-CM

## 2023-03-21 DIAGNOSIS — R110 Nausea: Secondary | ICD-10-CM | POA: Insufficient documentation

## 2023-03-21 DIAGNOSIS — Z8249 Family history of ischemic heart disease and other diseases of the circulatory system: Secondary | ICD-10-CM | POA: Diagnosis not present

## 2023-03-21 DIAGNOSIS — R519 Headache, unspecified: Secondary | ICD-10-CM | POA: Diagnosis not present

## 2023-03-21 LAB — IRON AND IRON BINDING CAPACITY (CC-WL,HP ONLY)
Iron: 54 ug/dL (ref 28–170)
Saturation Ratios: 7 % — ABNORMAL LOW (ref 10.4–31.8)
TIBC: 732 ug/dL — ABNORMAL HIGH (ref 250–450)
UIBC: 678 ug/dL — ABNORMAL HIGH (ref 148–442)

## 2023-03-21 LAB — FERRITIN: Ferritin: 13 ng/mL (ref 11–307)

## 2023-03-21 LAB — VITAMIN B12: Vitamin B-12: 237 pg/mL (ref 180–914)

## 2023-03-21 NOTE — Progress Notes (Signed)
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 female who has been referred to [place] by Leamon Arnt, CNM for evaluation and management of possible Hemoglobinopathy.   Patient is currently pregnant at [redacted] weeks. She has nausea, lightheadedness with seeing stars, and mild fatigue. She denies abdominal pain, leg edema, and ever having any anemia.   Patient's father has Hemachromatosis, and is heterozygous with 2 different genes.  Her father was diagnosed late in life during a routine blood panel, and her paternal grandfather died of a heart attack at a young age. Patient is unaware if she is a carrier of hemachromatosis and denies being tested for Hemachromatosis. She denies her mother having Hemachromatosis.   She notes she had a hemoglobin electrophoresis test done a few months ago.   She notes her husband get dizzy and has heart issues. Patient denies any family history of iron and blood issues. She reports no medical issues.   MEDICAL HISTORY:  Past Medical History:  Diagnosis Date   Anemia    Anxiety    Asthma    Migraine 05/16/2019    SURGICAL HISTORY: Past Surgical History:  Procedure Laterality Date   ROTATOR CUFF REPAIR Right 01/2017    SOCIAL HISTORY: Social History   Socioeconomic History   Marital status: Married    Spouse name: Tim   Number of children: Not on file   Years of education: Not on file   Highest education level: Not on file  Occupational History   Occupation: athlete, [employer] lifter   Occupation: Psychologist, educational  Tobacco Use   Smoking status: Never   Smokeless tobacco: Never  Substance and Sexual Activity   Alcohol use: No   Drug use: No   Sexual activity: Yes    Birth  control/protection: I.U.D.  Other Topics Concern   Not on file  Social History Narrative   Not on file   Social Determinants of Health   Financial Resource Strain: Not on file  Food Insecurity: Not on file  Transportation Needs: Not on file  Physical Activity: Not on file  Stress: Not on file  Social Connections: Not on file  Intimate Partner Violence: Not on file    FAMILY HISTORY: Family History  Problem Relation Age of Onset   Prostate cancer Other    Heart attack Paternal Grandfather        in 46s   Hyperlipidemia Paternal Grandfather    Healthy Father     ALLERGIES:  is allergic to adhesive [tape].  MEDICATIONS:  Current Outpatient Medications  Medication Sig Dispense Refill   albuterol (VENTOLIN HFA) 108 (90 Base) MCG/ACT inhaler Inhale 2 puffs into the lungs every 4 (four) hours as needed for wheezing or shortness of breath. 1 each 2   Continuous Glucose Sensor (FREESTYLE LIBRE 2 SENSOR) MISC Inject 1 each into the skin every 14 (fourteen) days. 2 each 0   levonorgestrel (MIRENA) 20 MCG/24HR IUD 1 Intra Uterine Device (1 each total) by Intrauterine route once. 1 each 0   rizatriptan (MAXALT-MLT) 10 MG disintegrating tablet Take 1 tablet (10 mg total) by mouth as needed for migraine. May repeat in 2 hours if needed 10 tablet 2   No current facility-administered medications for this visit.    REVIEW OF SYSTEMS:  10 Point review of Systems was done is negative except as noted above.  PHYSICAL EXAMINATION: ECOG PERFORMANCE STATUS: 1 - Symptomatic but completely ambulatory  . Vitals:   03/21/23 1115  BP: 121/79  Pulse: 74  Resp: 20  Temp: 98.3 F (36.8 C)  SpO2: 99%   Filed Weights   03/21/23 1115  Weight: 157 lb 9.6 oz (71.5 kg)   .Body mass index is 29.78 kg/m.  GENERAL:alert, in no acute distress and comfortable SKIN: no acute rashes, no significant lesions EYES: conjunctiva are pink and non-injected, sclera anicteric OROPHARYNX: MMM, no  exudates, no oropharyngeal erythema or ulceration NECK: supple, no JVD LYMPH:  no palpable lymphadenopathy in the cervical, axillary or inguinal regions LUNGS: clear to auscultation b/l with normal respiratory effort HEART: regular rate & rhythm ABDOMEN:  normoactive bowel sounds , non tender, not distended. Extremity: no pedal edema PSYCH: alert & oriented x 3 with fluent speech NEURO: no focal motor/sensory deficits  LABORATORY DATA:  I have reviewed the data as listed .    Latest Ref Rng & Units 03/20/2021    8:09 AM 12/13/2011    9:17 AM 08/13/2010    2:30 PM  CBC  WBC 4.0 - 10.5 K/uL 4.8  4.8  10.6   Hemoglobin 12.0 - 15.0 g/dL 16.1  09.6  04.5   Hematocrit 36.0 - 46.0 % 41.8  41.1  42.9   Platelets 150.0 - 400.0 K/uL 175.0  151.0  180    .    Latest Ref Rng & Units 03/20/2021    8:09 AM 12/13/2011    9:17 AM 08/13/2010    2:30 PM  CMP  Glucose 70 - 99 mg/dL 409  811  914   BUN 6 - 23 mg/dL 19  17  6    Creatinine 0.40 - 1.20 mg/dL 7.82  0.9  9.56   Sodium 135 - 145 mEq/L 138  141  141   Potassium 3.5 - 5.1 mEq/L 4.0  4.2  4.0   Chloride 96 - 112 mEq/L 105  109  104   CO2 19 - 32 mEq/L 26  26  25    Calcium 8.4 - 10.5 mg/dL 9.0  9.0  9.5   Total Protein 6.0 - 8.3 g/dL 6.8  6.5    Total Bilirubin 0.2 - 1.2 mg/dL 0.5  0.3    Alkaline Phos 39 - 117 U/L 42  68    AST 0 - 37 U/L 17  27    ALT 0 - 35 U/L 17  30         RADIOGRAPHIC STUDIES: I have personally reviewed the radiological images as listed and agreed with the findings in the report. No results found.  ASSESSMENT & PLAN:   Patient is a 32 y.o. 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 Less likely to be beta thalassemia trait/minor Fhx of Hemochromatosis in her father who has compund heterozygous state. Asthma History of migraines Headaches   PLAN: -No baseline anemia -No RBC microcytosis, no relative polycythemia ,  Hemoglobin A2 not >3.5--- unlikely to be beta thalathemia trait/minor. -Informed patient that Hgb A2 normal range stops at 3.5 %, and since patient is at 3.3% this is not a cause of concern -Informed patient that slightly elevated HGAB 2  is likely due to iron deficiency anemia from pregnancy -Discussed the options of getting tests for Hemachromatosis, hemoglobin electrophoresis after labor, and blood tests -Answered  all of patient's questions  -Will order hemoglobin electrophoresis after labor -Patient will proceed with blood and Hemachromatosis tests after visit -Follow-up on the phone in 2 weeks to discus lab results   . Orders Placed This Encounter  Procedures   Vitamin B12    Standing Status:   Future    Number of Occurrences:   1    Standing Expiration Date:   03/20/2024   Ferritin    Standing Status:   Future    Number of Occurrences:   1    Standing Expiration Date:   03/20/2024   Hgb Fractionation Cascade    Standing Status:   Future    Number of Occurrences:   1    Standing Expiration Date:   03/20/2024   Hemochromatosis DNA, PCR    Standing Status:   Future    Number of Occurrences:   1    Standing Expiration Date:   03/20/2024    FOLLOW-UP: Labs today Phone visit in 2 weeks with Dr Candise Che  The total time spent in the appointment was 60 minutes* .  All of the patient's questions were answered with apparent satisfaction. The patient knows to call the clinic with any problems, questions or concerns.   Wyvonnia Lora MD MS AAHIVMS [facility] Hematology/Oncology Physician [facility]  .*Total Encounter Time as defined by the Centers for Medicare and Medicaid Services includes, in addition to the face-to-face time of a patient visit (documented in the note above) non-face-to-face time: obtaining and reviewing outside history, ordering and reviewing medications, tests or procedures, care coordination (communications with other health care professionals or  caregivers) and documentation in the medical record.   Alben Deeds Teague,acting as a Neurosurgeon for Wyvonnia Lora, MD.,have documented all relevant documentation on the behalf of Wyvonnia Lora, MD,as directed by  Wyvonnia Lora, MD while in the presence of Wyvonnia Lora, MD.  .I have reviewed the above documentation for accuracy and completeness, and I agree with the above. Johney Maine MD

## 2023-03-22 ENCOUNTER — Telehealth: Payer: Self-pay | Admitting: Hematology

## 2023-03-23 LAB — HGB FRACTIONATION CASCADE
Hgb A2: 3.2 % (ref 1.8–3.2)
Hgb A: 96.8 % (ref 96.4–98.8)
Hgb F: 0 % (ref 0.0–2.0)
Hgb S: 0 %

## 2023-03-25 ENCOUNTER — Encounter (HOSPITAL_COMMUNITY): Payer: Self-pay | Admitting: *Deleted

## 2023-03-28 ENCOUNTER — Encounter (HOSPITAL_COMMUNITY): Payer: Self-pay | Admitting: *Deleted

## 2023-03-28 ENCOUNTER — Telehealth (HOSPITAL_COMMUNITY): Payer: Self-pay | Admitting: *Deleted

## 2023-03-28 NOTE — Telephone Encounter (Signed)
Preadmission screen  

## 2023-04-04 ENCOUNTER — Telehealth: Payer: Self-pay | Admitting: Hematology

## 2023-04-04 NOTE — Telephone Encounter (Signed)
Patient is currently in the hospital, needed to reschedule her visit, patient is aware of rescheduled times/dates

## 2023-04-06 ENCOUNTER — Telehealth: Payer: 59 | Admitting: Hematology

## 2023-04-08 ENCOUNTER — Other Ambulatory Visit: Payer: Self-pay

## 2023-04-08 ENCOUNTER — Inpatient Hospital Stay (HOSPITAL_COMMUNITY): Payer: 59 | Admitting: Anesthesiology

## 2023-04-08 ENCOUNTER — Encounter (HOSPITAL_COMMUNITY): Payer: Self-pay | Admitting: Obstetrics and Gynecology

## 2023-04-08 ENCOUNTER — Inpatient Hospital Stay (HOSPITAL_COMMUNITY)
Admission: AD | Admit: 2023-04-08 | Discharge: 2023-04-10 | DRG: 806 | Disposition: A | Discharge status: Home / Self Care | Payer: 59 | Attending: Obstetrics | Admitting: Obstetrics

## 2023-04-08 DIAGNOSIS — D696 Thrombocytopenia, unspecified: Secondary | ICD-10-CM | POA: Diagnosis present

## 2023-04-08 DIAGNOSIS — O43193 Other malformation of placenta, third trimester: Secondary | ICD-10-CM | POA: Diagnosis present

## 2023-04-08 DIAGNOSIS — O480 Post-term pregnancy: Secondary | ICD-10-CM | POA: Diagnosis present

## 2023-04-08 DIAGNOSIS — D6959 Other secondary thrombocytopenia: Secondary | ICD-10-CM | POA: Diagnosis present

## 2023-04-08 DIAGNOSIS — Z3A40 40 weeks gestation of pregnancy: Secondary | ICD-10-CM

## 2023-04-08 DIAGNOSIS — O9912 Other diseases of the blood and blood-forming organs and certain disorders involving the immune mechanism complicating childbirth: Secondary | ICD-10-CM | POA: Diagnosis present

## 2023-04-08 LAB — CBC
HCT: 37 % (ref 36.0–46.0)
Hemoglobin: 12.4 g/dL (ref 12.0–15.0)
MCH: 29.2 pg (ref 26.0–34.0)
MCHC: 33.5 g/dL (ref 30.0–36.0)
MCV: 87.1 fL (ref 80.0–100.0)
Platelets: 121 10*3/uL — ABNORMAL LOW (ref 150–400)
RBC: 4.25 MIL/uL (ref 3.87–5.11)
RDW: 14.2 % (ref 11.5–15.5)
WBC: 8.4 10*3/uL (ref 4.0–10.5)
nRBC: 0 % (ref 0.0–0.2)

## 2023-04-08 LAB — TYPE AND SCREEN
ABO/RH(D): O POS
Antibody Screen: NEGATIVE

## 2023-04-08 LAB — RPR: RPR Ser Ql: NONREACTIVE

## 2023-04-08 LAB — POCT FERN TEST: POCT Fern Test: NEGATIVE

## 2023-04-08 MED ORDER — OXYTOCIN-SODIUM CHLORIDE 30-0.9 UT/500ML-% IV SOLN
INTRAVENOUS | Status: AC
  Filled 2023-04-08: qty 500

## 2023-04-08 MED ORDER — PHENYLEPHRINE 80 MCG/ML (10ML) SYRINGE FOR IV PUSH (FOR BLOOD PRESSURE SUPPORT): 80.0000 ug | PREFILLED_SYRINGE | INTRAVENOUS | Status: DC | PRN

## 2023-04-08 MED ORDER — OXYTOCIN-SODIUM CHLORIDE 30-0.9 UT/500ML-% IV SOLN
2.5000 [IU]/h | INTRAVENOUS | Status: DC
  Administered 2023-04-09: 2.5 [IU]/h via INTRAVENOUS

## 2023-04-08 MED ORDER — LACTATED RINGERS IV SOLN: 500.0000 mL | INTRAVENOUS | Status: DC | PRN

## 2023-04-08 MED ORDER — ACETAMINOPHEN 325 MG PO TABS: 650.0000 mg | ORAL_TABLET | ORAL | Status: DC | PRN

## 2023-04-08 MED ORDER — SODIUM CHLORIDE 0.9% FLUSH: 3.0000 mL | Freq: Two times a day (BID) | INTRAVENOUS | Status: DC

## 2023-04-08 MED ORDER — SODIUM CHLORIDE 0.9 % IV SOLN: 250.0000 mL | INTRAVENOUS | Status: DC | PRN

## 2023-04-08 MED ORDER — SOD CITRATE-CITRIC ACID 500-334 MG/5ML PO SOLN: 30.0000 mL | ORAL | Status: DC | PRN

## 2023-04-08 MED ORDER — FENTANYL-BUPIVACAINE-NACL 0.5-0.125-0.9 MG/250ML-% EP SOLN
12.0000 mL/h | EPIDURAL | Status: DC | PRN
  Administered 2023-04-08: 11 mL/h via EPIDURAL
  Filled 2023-04-08: qty 250

## 2023-04-08 MED ORDER — TERBUTALINE SULFATE 1 MG/ML IJ SOLN: 0.2500 mg | Freq: Once | INTRAMUSCULAR | Status: DC | PRN

## 2023-04-08 MED ORDER — OXYTOCIN BOLUS FROM INFUSION
333.0000 mL | Freq: Once | INTRAVENOUS | Status: AC
  Administered 2023-04-08: 333 mL via INTRAVENOUS

## 2023-04-08 MED ORDER — EPHEDRINE 5 MG/ML INJ: 10.0000 mg | INTRAVENOUS | Status: DC | PRN

## 2023-04-08 MED ORDER — ACETAMINOPHEN 10 MG/ML IV SOLN
1000.0000 mg | Freq: Once | INTRAVENOUS | Status: AC
  Administered 2023-04-08: 1000 mg via INTRAVENOUS
  Filled 2023-04-08: qty 100

## 2023-04-08 MED ORDER — FENTANYL CITRATE (PF) 100 MCG/2ML IJ SOLN: 50.0000 ug | INTRAMUSCULAR | Status: DC | PRN

## 2023-04-08 MED ORDER — OXYTOCIN 10 UNIT/ML IJ SOLN: 10.0000 [IU] | Freq: Once | INTRAMUSCULAR | Status: DC

## 2023-04-08 MED ORDER — ALBUTEROL SULFATE (2.5 MG/3ML) 0.083% IN NEBU: 2.5000 mg | INHALATION_SOLUTION | RESPIRATORY_TRACT | Status: DC | PRN

## 2023-04-08 MED ORDER — ONDANSETRON HCL 4 MG/2ML IJ SOLN
4.0000 mg | Freq: Four times a day (QID) | INTRAMUSCULAR | Status: DC | PRN
  Administered 2023-04-08: 4 mg via INTRAVENOUS
  Filled 2023-04-08 (×2): qty 2

## 2023-04-08 MED ORDER — DIPHENHYDRAMINE HCL 50 MG/ML IJ SOLN: 12.5000 mg | INTRAMUSCULAR | Status: DC | PRN

## 2023-04-08 MED ORDER — SODIUM CHLORIDE 0.9% FLUSH: 3.0000 mL | INTRAVENOUS | Status: DC | PRN

## 2023-04-08 MED ORDER — HYDROXYZINE HCL 50 MG PO TABS: 50.0000 mg | ORAL_TABLET | Freq: Four times a day (QID) | ORAL | Status: DC | PRN

## 2023-04-08 MED ORDER — OXYTOCIN-SODIUM CHLORIDE 30-0.9 UT/500ML-% IV SOLN
1.0000 m[IU]/min | INTRAVENOUS | Status: DC
  Administered 2023-04-08: 4 m[IU]/min via INTRAVENOUS
  Administered 2023-04-08: 12 m[IU]/min via INTRAVENOUS

## 2023-04-08 MED ORDER — LIDOCAINE HCL (PF) 1 % IJ SOLN: 30.0000 mL | INTRAMUSCULAR | Status: DC | PRN

## 2023-04-08 MED ORDER — ACETAMINOPHEN 500 MG PO TABS
1000.0000 mg | ORAL_TABLET | Freq: Once | ORAL | Status: DC
  Filled 2023-04-08: qty 2

## 2023-04-08 MED ORDER — SODIUM CHLORIDE 0.9 % IV SOLN
3.0000 g | Freq: Four times a day (QID) | INTRAVENOUS | Status: DC
  Administered 2023-04-08: 3 g via INTRAVENOUS
  Filled 2023-04-08 (×3): qty 8

## 2023-04-08 MED ORDER — LACTATED RINGERS IV SOLN
500.0000 mL | Freq: Once | INTRAVENOUS | Status: AC
  Administered 2023-04-08: 500 mL via INTRAVENOUS

## 2023-04-08 MED ORDER — LIDOCAINE HCL (PF) 1 % IJ SOLN
INTRAMUSCULAR | Status: DC | PRN
  Administered 2023-04-08 (×2): 4 mL via EPIDURAL

## 2023-04-08 MED ORDER — LACTATED RINGERS IV SOLN: INTRAVENOUS | Status: DC

## 2023-04-08 NOTE — Anesthesia Preprocedure Evaluation (Signed)
Anesthesia Evaluation  Patient identified by MRN, date of birth, ID band Patient awake    Reviewed: Allergy & Precautions, Patient's Chart, lab work & pertinent test results  Airway Mallampati: II       Dental no notable dental hx. (+) Dental Advisory Given, Teeth Intact   Pulmonary asthma    Pulmonary exam normal        Cardiovascular negative cardio ROS Normal cardiovascular exam Rhythm:Regular Rate:Normal     Neuro/Psych  Headaches PSYCHIATRIC DISORDERS Anxiety     Autism spectrum disorder   GI/Hepatic negative GI ROS, Neg liver ROS,,,  Endo/Other  negative endocrine ROS    Renal/GU negative Renal ROS  negative genitourinary   Musculoskeletal negative musculoskeletal ROS (+)    Abdominal Normal abdominal exam  (+)   Peds  Hematology  (+) Blood dyscrasia, anemia Thrombocytopenia- Plt 121k   Anesthesia Other Findings   Reproductive/Obstetrics (+) Pregnancy                             Anesthesia Physical Anesthesia Plan  ASA: 2  Anesthesia Plan: Epidural   Post-op Pain Management:    Induction:   PONV Risk Score and Plan:   Airway Management Planned: Natural Airway  Additional Equipment:   Intra-op Plan:   Post-operative Plan:   Informed Consent: I have reviewed the patients History and Physical, chart, labs and discussed the procedure including the risks, benefits and alternatives for the proposed anesthesia with the patient or authorized representative who has indicated his/her understanding and acceptance.       Plan Discussed with: Anesthesiologist  Anesthesia Plan Comments:        Anesthesia Quick Evaluation

## 2023-04-08 NOTE — Progress Notes (Signed)
       S: Comfortable with epidural, has been using various positions to facilitate descent. Lots of rectal pressure earlier, now better after PCA epidural use. Feeling chills.  Family and doula at [facility] supportive.   O: Vitals:   04/08/23 1933 04/08/23 2030 04/08/23 2049 04/08/23 2050  BP: 102/61 99/73    Pulse: 86 75    Resp: 18 17    Temp:   100.1 F (37.8 C)   TempSrc:   Axillary   SpO2:    100%  Height:         FHT:  FHR: 150 bpm, variability: moderate,  accelerations:  Present,  decelerations:  Present episodic variable decel with movement to reposition UC:   regular, every 2-3 minutes SVE:   Dilation: 8.5 Effacement (%): 90, 100 Station: Plus 1 Exam by:: Arlan Organ CNM Vertex, ROA Meconium fluid noted on towel, head well engaged, some molding.   A / P: Augmentation of labor, progressing well, slow progress but continued cervical change.  Maternal temp and tachy, suspect chorio Will start Unasyn 3 gm IV q 6 hrs and Tylenol 1gm Q6 hrs PRN for temp > 100.4  Fetal Wellbeing:  Category I Pain Control:  Epidural  Dr. Amado Nash updated w/ patient status and POC Anticipated MOD:   cautious for NSVB  Neta Mends, CNM, MSN 04/08/2023, 9:10 PM

## 2023-04-08 NOTE — Progress Notes (Signed)
Jermaya Drucker is a 32 y.o. G1P0 at [redacted]w[redacted]d admitted for prodromal labor / augmentation  Subjective: Feels minimal ctx, ambulating in hallways with doula. Pitocin titration ongoing. .   Objective: Vitals:   04/08/23 0907  BP: 107/72  Pulse: 93  Resp: 14  Temp: (!) 97.5 F (36.4 C)  TempSrc: Oral  SpO2: 100%     FHT:  FHR: 130 bpm, variability: moderate,  accelerations:  Present,  decelerations:  Absent UC:   regular, every 3 minutes SVE:   Dilation: 4.5 Effacement (%): 70 Station: -2, -3 Exam by:: Arlan Organ CNM AROM w/ patient consent, copious amount of fluid, clear.  Vertex applied to cervix LOA  Pitocin 8 mu/min  Labs:   Recent Labs    04/08/23 0940  WBC 8.4  HGB 12.4  HCT 37.0  PLT 121*    Assessment / Plan: G1P0 32 y.o. [redacted]w[redacted]d Protracted latent phase Mild thrombocytopenia, asymptomatic - will follow  Labor:  Pitocin titration, now w/ AROM Will work w/ lunges and spinning babies positioning to encourage optimal pelvic floor balance and fetal engagement. Continues to ambulate. Preeclampsia:  no sx Fetal Wellbeing:  Category I Pain Control:  Labor support without medications I/D:   GBS neg  Anticipated MOD:  NSVD  Neta Mends, CNM, MSN 04/08/2023, 12:09 PM

## 2023-04-08 NOTE — H&P (Signed)
OB ADMISSION/ HISTORY & PHYSICAL:  Admission Date: 04/08/2023  8:52 AM  Admit Diagnosis: Prodromal labor   Gina Stephenson is a 32 y.o. female presenting for LOF overnight, notes wet underwear with increased discharge but no overt gushes. Reports contractions irregular for past 2 days, s/p membrane sweep in office 2 days ago after OF expulsed. + back pain, difficulty sleeping, desires augmentation and active labor. Ferning negative and no vaginal pooling in MAU.  Plans unmedicated birth, hydro, class completed. Open to epidural as needed.  Her mother is here for labor support, spouse Jorja Loa and Migdalia Dk to join later.   Prenatal History: G1P0   EDC : 04/03/2023, by Other Basis  Prenatal care at [facility] since 23 wks, TOC from [facility] for WB/CNM care option Primary:  Renae Fickle, CNM  Prenatal course complicated by: Hx migraines, negative nero w/u prior to pregnancy Beta Thal minor, no anemia but low ferritin (13), on oral Fe supplement  Prenatal Labs: ABO, Rh: O (01/24 0000)  Antibody: Negative (01/24 0000) Rubella: Immune (01/24 0000)  RPR: Nonreactive (01/24 0000)  HBsAg: Negative (01/24 0000)  HIV: Non-reactive (01/24 0000)  GBS: Negative/-- (06/12 0000)  GDMS: self-screening wnl Genetic Screening: NIPS LR XX Ultrasound: normal XX anatomy, AGA, ant plac   Medical / Surgical History :  Past medical history:  Past Medical History:  Diagnosis Date   Anemia    Anxiety    Asthma    Migraine 05/16/2019     Past surgical history:  Past Surgical History:  Procedure Laterality Date   ROTATOR CUFF REPAIR Right 01/2017     Family History:  Family History  Problem Relation Age of Onset   Prostate cancer Other    Heart attack Paternal Grandfather        in 71s   Hyperlipidemia Paternal Grandfather    Healthy Father      Social History:  reports that she has never smoked. She has never used smokeless tobacco. She reports that she does not drink alcohol and  does not use drugs.  Allergies: Adhesive [tape]   Current Medications at time of admission:  Medications Prior to Admission  Medication Sig Dispense Refill Last Dose   Prenatal Vit-Fe Fumarate-FA (PRENATAL MULTIVITAMIN) TABS tablet Take 1 tablet by mouth daily at 12 noon.   04/07/2023   albuterol (VENTOLIN HFA) 108 (90 Base) MCG/ACT inhaler Inhale 2 puffs into the lungs every 4 (four) hours as needed for wheezing or shortness of breath. 1 each 2    butalbital-acetaminophen-caffeine (FIORICET) 50-325-40 MG tablet Take 1-2 tablets by mouth every 8 (eight) hours as needed.      Continuous Glucose Sensor (FREESTYLE LIBRE 2 SENSOR) MISC Inject 1 each into the skin every 14 (fourteen) days. (Patient not taking: Reported on 03/21/2023) 2 each 0    rizatriptan (MAXALT-MLT) 10 MG disintegrating tablet Take 1 tablet (10 mg total) by mouth as needed for migraine. May repeat in 2 hours if needed (Patient not taking: Reported on 03/21/2023) 10 tablet 2      Review of Systems: ROS As noted above Physical Exam: Vital signs and nursing notes reviewed.  Patient Vitals for the past 24 hrs:  BP Temp Temp src Pulse Resp SpO2  04/08/23 0907 107/72 (!) 97.5 F (36.4 C) Oral 93 14 100 %     General: AAO x 3, NAD, coping, enxious Heart: RRR Lungs:CTAB Abdomen: Gravid, NT, Leopold's vertex, LOP Extremities: no edema Genitalia / VE: Dilation: 4.5  Effacement (%): 70 Station: -2, -3 Presentation: Vertex Exam by:: Arlan Organ, CNM  BBOW, ballotable   FHR: 145 BPM, moderate variability, + accels, no decels TOCO: Ctx occasional  Labs:   Pending T&S, CBC, RPR  No results for input(s): "WBC", "HGB", "HCT", "PLT" in the last 72 hours.   Assessment/Plan:  32 y.o. G1P0 at [redacted]w[redacted]d  Fetal wellbeing - FHT category 1 EFW 7-7.5 lbs, AGA  Labor: prodroma Admit to L&D, routine orders Pitocin augmentation and AROM when head engaged, DC Pitocin for active labor and hydrotherapy  GBS neg Rubella imm Rh  pos  Pain control: desires unmedicated labor, open to epidural as needed Analgesia/anesthesia PRN  Anticipated MOD: NSVB  Plans to breastfeed POC discussed with patient and support team, all questions answered.  Dr Ernestina Penna notified of admission / plan of care   Neta Mends CNM, MSN 04/08/2023, 9:57 AM

## 2023-04-08 NOTE — Anesthesia Procedure Notes (Signed)
Epidural Patient location during procedure: OB Start time: 04/08/2023 2:00 PM End time: 04/08/2023 2:08 PM  Staffing Anesthesiologist: Mal Amabile, MD Performed: anesthesiologist   Preanesthetic Checklist Completed: patient identified, IV checked, site marked, risks and benefits discussed, surgical consent, monitors and equipment checked, pre-op evaluation and timeout performed  Epidural Patient position: sitting Prep: DuraPrep and site prepped and draped Patient monitoring: continuous pulse ox and blood pressure Approach: midline Location: L3-L4 Injection technique: LOR air  Needle:  Needle type: Tuohy  Needle gauge: 17 G Needle length: 9 cm and 9 Needle insertion depth: 4 cm Catheter type: closed end flexible Catheter size: 19 Gauge Catheter at skin depth: 9 cm Test dose: negative and Other  Assessment Events: blood not aspirated, no cerebrospinal fluid, injection not painful, no injection resistance, no paresthesia and negative IV test  Additional Notes Patient identified. Risks and benefits discussed including failed block, incomplete  Pain control, post dural puncture headache, nerve damage, paralysis, blood pressure Changes, nausea, vomiting, reactions to medications-both toxic and allergic and post Partum back pain. All questions were answered. Patient expressed understanding and wished to proceed. Sterile technique was used throughout procedure. Epidural site was Dressed with sterile barrier dressing. No paresthesias, signs of intravascular injection Or signs of intrathecal spread were encountered.  Patient was more comfortable after the epidural was dosed. Please see RN's note for documentation of vital signs and FHR which are stable.

## 2023-04-08 NOTE — MAU Note (Signed)
.  Gina Stephenson is a 32 y.o. at [redacted]w[redacted]d here in MAU reporting: leaking fluid since 1200 yesterday. States she's unsure if its amniotic fluid or discharge. Also reports some lower abdominal cramping since yesterday. Last SVE 4cm.   Pain score: 6 Vitals:   04/08/23 0907  BP: 107/72  Pulse: 93  Resp: 14  Temp: (!) 97.5 F (36.4 C)  SpO2: 100%     FHT:135

## 2023-04-09 ENCOUNTER — Encounter (HOSPITAL_COMMUNITY): Payer: Self-pay | Admitting: Obstetrics

## 2023-04-09 LAB — CBC
HCT: 35.9 % — ABNORMAL LOW (ref 36.0–46.0)
Hemoglobin: 11.8 g/dL — ABNORMAL LOW (ref 12.0–15.0)
MCH: 28 pg (ref 26.0–34.0)
MCHC: 32.9 g/dL (ref 30.0–36.0)
MCV: 85.1 fL (ref 80.0–100.0)
Platelets: 113 10*3/uL — ABNORMAL LOW (ref 150–400)
RBC: 4.22 MIL/uL (ref 3.87–5.11)
RDW: 14.3 % (ref 11.5–15.5)
WBC: 17.7 10*3/uL — ABNORMAL HIGH (ref 4.0–10.5)
nRBC: 0 % (ref 0.0–0.2)

## 2023-04-09 MED ORDER — COCONUT OIL OIL
1.0000 | TOPICAL_OIL | Status: DC | PRN
  Administered 2023-04-09: 1 via TOPICAL

## 2023-04-09 MED ORDER — BENZOCAINE-MENTHOL 20-0.5 % EX AERO
1.0000 | INHALATION_SPRAY | CUTANEOUS | Status: DC | PRN
  Administered 2023-04-09 – 2023-04-10 (×2): 1 via TOPICAL
  Filled 2023-04-09 (×2): qty 56

## 2023-04-09 MED ORDER — SIMETHICONE 80 MG PO CHEW: 80.0000 mg | CHEWABLE_TABLET | ORAL | Status: DC | PRN

## 2023-04-09 MED ORDER — WITCH HAZEL-GLYCERIN EX PADS: 1.0000 | MEDICATED_PAD | CUTANEOUS | Status: DC | PRN

## 2023-04-09 MED ORDER — TETANUS-DIPHTH-ACELL PERTUSSIS 5-2.5-18.5 LF-MCG/0.5 IM SUSY: 0.5000 mL | PREFILLED_SYRINGE | Freq: Once | INTRAMUSCULAR | Status: DC

## 2023-04-09 MED ORDER — PRENATAL MULTIVITAMIN CH
1.0000 | ORAL_TABLET | Freq: Every day | ORAL | Status: DC
  Administered 2023-04-09 – 2023-04-10 (×2): 1 via ORAL
  Filled 2023-04-09 (×2): qty 1

## 2023-04-09 MED ORDER — ONDANSETRON HCL 4 MG PO TABS: 4.0000 mg | ORAL_TABLET | ORAL | Status: DC | PRN

## 2023-04-09 MED ORDER — DIPHENHYDRAMINE HCL 25 MG PO CAPS: 25.0000 mg | ORAL_CAPSULE | Freq: Four times a day (QID) | ORAL | Status: DC | PRN

## 2023-04-09 MED ORDER — ZOLPIDEM TARTRATE 5 MG PO TABS: 5.0000 mg | ORAL_TABLET | Freq: Every evening | ORAL | Status: DC | PRN

## 2023-04-09 MED ORDER — SENNOSIDES-DOCUSATE SODIUM 8.6-50 MG PO TABS
2.0000 | ORAL_TABLET | ORAL | Status: DC
  Administered 2023-04-09 (×2): 2 via ORAL
  Filled 2023-04-09 (×2): qty 2

## 2023-04-09 MED ORDER — IBUPROFEN 600 MG PO TABS
600.0000 mg | ORAL_TABLET | Freq: Four times a day (QID) | ORAL | Status: DC
  Administered 2023-04-09 – 2023-04-10 (×6): 600 mg via ORAL
  Filled 2023-04-09 (×6): qty 1

## 2023-04-09 MED ORDER — DIBUCAINE (PERIANAL) 1 % EX OINT: 1.0000 | TOPICAL_OINTMENT | CUTANEOUS | Status: DC | PRN

## 2023-04-09 MED ORDER — FLEET ENEMA 7-19 GM/118ML RE ENEM: 1.0000 | ENEMA | Freq: Every day | RECTAL | Status: DC | PRN

## 2023-04-09 MED ORDER — BISACODYL 10 MG RE SUPP: 10.0000 mg | Freq: Every day | RECTAL | Status: DC | PRN

## 2023-04-09 MED ORDER — ACETAMINOPHEN 500 MG PO TABS
1000.0000 mg | ORAL_TABLET | Freq: Four times a day (QID) | ORAL | Status: DC
  Administered 2023-04-09 – 2023-04-10 (×5): 1000 mg via ORAL
  Filled 2023-04-09 (×5): qty 2

## 2023-04-09 MED ORDER — ONDANSETRON HCL 4 MG/2ML IJ SOLN: 4.0000 mg | INTRAMUSCULAR | Status: DC | PRN

## 2023-04-09 NOTE — Plan of Care (Signed)
  Problem: Education: Goal: Knowledge of Childbirth will improve Outcome: Completed/Met Goal: Ability to make informed decisions regarding treatment and plan of care will improve Outcome: Completed/Met Goal: Ability to state and carry out methods to decrease the pain will improve Outcome: Completed/Met Goal: Individualized Educational Video(s) Outcome: Completed/Met   Problem: Coping: Goal: Ability to verbalize concerns and feelings about labor and delivery will improve Outcome: Completed/Met   Problem: Life Cycle: Goal: Ability to make normal progression through stages of labor will improve Outcome: Completed/Met Goal: Ability to effectively push during vaginal delivery will improve Outcome: Completed/Met   Problem: Role Relationship: Goal: Will demonstrate positive interactions with the child Outcome: Completed/Met   Problem: Safety: Goal: Risk of complications during labor and delivery will decrease Outcome: Completed/Met   Problem: Pain Management: Goal: Relief or control of pain from uterine contractions will improve Outcome: Completed/Met   Problem: Education: Goal: Knowledge of General Education information will improve Description: Including pain rating scale, medication(s)/side effects and non-pharmacologic comfort measures Outcome: Completed/Met   Problem: Health Behavior/Discharge Planning: Goal: Ability to manage health-related needs will improve Outcome: Completed/Met   Problem: Clinical Measurements: Goal: Ability to maintain clinical measurements within normal limits will improve Outcome: Completed/Met Goal: Will remain free from infection Outcome: Completed/Met Goal: Diagnostic test results will improve Outcome: Completed/Met Goal: Respiratory complications will improve Outcome: Completed/Met Goal: Cardiovascular complication will be avoided Outcome: Completed/Met   Problem: Activity: Goal: Risk for activity intolerance will decrease Outcome:  Completed/Met   Problem: Nutrition: Goal: Adequate nutrition will be maintained Outcome: Completed/Met   Problem: Coping: Goal: Level of anxiety will decrease Outcome: Completed/Met   Problem: Elimination: Goal: Will not experience complications related to bowel motility Outcome: Completed/Met Goal: Will not experience complications related to urinary retention Outcome: Completed/Met   Problem: Pain Managment: Goal: General experience of comfort will improve Outcome: Completed/Met   Problem: Safety: Goal: Ability to remain free from injury will improve Outcome: Completed/Met   Problem: Skin Integrity: Goal: Risk for impaired skin integrity will decrease Outcome: Completed/Met   Problem: Education: Goal: Knowledge of condition will improve Outcome: Completed/Met Goal: Individualized Educational Video(s) Outcome: Completed/Met Goal: Individualized Newborn Educational Video(s) Outcome: Completed/Met   Problem: Activity: Goal: Will verbalize the importance of balancing activity with adequate rest periods Outcome: Completed/Met Goal: Ability to tolerate increased activity will improve Outcome: Completed/Met   Problem: Coping: Goal: Ability to identify and utilize available resources and services will improve Outcome: Completed/Met   Problem: Life Cycle: Goal: Chance of risk for complications during the postpartum period will decrease Outcome: Completed/Met   Problem: Role Relationship: Goal: Ability to demonstrate positive interaction with newborn will improve Outcome: Completed/Met   Problem: Skin Integrity: Goal: Demonstration of wound healing without infection will improve Outcome: Completed/Met   

## 2023-04-09 NOTE — Progress Notes (Signed)
       PPD # 1 S/P NSVD  Live born female  Birth Weight: 7 lb 10.4 oz (3470 g) APGAR: 8, 9  Newborn Delivery   Birth date/time: 04/08/2023 23:41:45 Delivery type: Vaginal, Spontaneous    Baby name: Lauris Poag Delivering provider: Arlan Organ C Episiotomy:None  Lacerations:2nd degree;Perineal   Feeding: breast  Pain control at delivery: Epidural  S:  Reports feeling well, some perineal discomfort but tolerable.             Tolerating po/ No nausea or vomiting             Bleeding is moderate             Pain controlled with acetaminophen and ibuprofen (OTC)             Up ad lib / ambulatory / voiding without difficulties   O:  A & O x 3, in no apparent distress              VS:  Vitals:   04/09/23 0125 04/09/23 0148 04/09/23 0257 04/09/23 0721  BP: 116/69 138/68 104/71 110/79  Pulse: (!) 53 76 81 60  Resp: 17 18 18 18   Temp:  98.4 F (36.9 C) 98.3 F (36.8 C) 98.3 F (36.8 C)  TempSrc:  Oral Oral Oral  SpO2:  97% 99% 97%  Weight:      Height:        LABS:  Recent Labs    04/08/23 0940 04/09/23 0529  WBC 8.4 17.7*  HGB 12.4 11.8*  HCT 37.0 35.9*  PLT 121* 113*    Blood type: --/--/O POS (07/12 0940)  Rubella: Immune (01/24 0000)   I&O: I/O last 3 completed shifts: In: -  Out: 200 [Blood:200]          No intake/output data recorded.   Gen: AAO x 3, NAD  Abdomen: soft, non-tender, non-distended             Fundus: firm, non-tender, U-1  Perineum: repair intact, mild edema  Lochia: small  Extremities: no edema, no calf pain or tenderness    A/P: PPD # 1 32 y.o., G1P1001   Principal Problem:   SVD (spontaneous vaginal delivery) Active Problems:   Benign gestational thrombocytopenia (HCC)  - asymptomatic, plts stable   Postpartum care following vaginal delivery 7/12   Perineal laceration, second degree   Doing well - stable status  Routine post partum orders  Anticipate discharge tomorrow    Neta Mends, MSN, CNM 04/09/2023, 8:53  AM

## 2023-04-09 NOTE — Lactation Note (Signed)
This note was copied from a baby's chart. Lactation Consultation Note  Patient Name: Girl Gina Stephenson [id number] Date: 04/09/2023 Age:32 hours Reason for consult: Initial assessment;1st time breastfeeding;Primapara;Term. P1, term female infant, Birth Parent latched infant on her right breast using the football hold position, infant sustained latch and BF for 7 minutes becoming sleepy. LC discussed hand expression using breast model and Birth Parent self expressed 1 ml of colostrum that was spoon fed to infant. Birth Parent will continue to BF infant by cues, on demand, 8 to 12+ times within 24 hours, skin to skin. Birth Parent knows to call RN/LC for further latch assistance if needed. LC discussed infant's input and output. Birth Parent was made aware of O/P services, breastfeeding support groups, community resources, and our phone # for post-discharge questions.    Maternal Data Has patient been taught Hand Expression?: Yes Does the patient have breastfeeding experience prior to this delivery?: No  Feeding Mother's Current Feeding Choice: Breast Milk  LATCH Score Latch: Grasps breast easily, tongue down, lips flanged, rhythmical sucking.  Audible Swallowing: A few with stimulation  Type of Nipple: Everted at rest and after stimulation  Comfort (Breast/Nipple): Soft / non-tender  Hold (Positioning): Assistance needed to correctly position infant at breast and maintain latch.  LATCH Score: 8   Lactation Tools Discussed/Used    Interventions Interventions: Support pillows;Position options;Skin to skin;Assisted with latch;Breast feeding basics reviewed;Adjust position;Education;Breast massage;Breast compression;Hand express;Expressed milk;LC Services brochure  Discharge Pump: DEBP;Personal  Consult Status Consult Status: Follow-up Date: 04/09/23 Follow-up type: In-patient    Frederico Hamman 04/09/2023, 2:53 AM

## 2023-04-09 NOTE — Anesthesia Postprocedure Evaluation (Signed)
Anesthesia Post Note  Patient: Gina Stephenson  Procedure(s) Performed: AN AD HOC LABOR EPIDURAL     Patient location during evaluation: Mother Baby Anesthesia Type: Epidural Level of consciousness: awake and alert and oriented Pain management: satisfactory to patient Vital Signs Assessment: post-procedure vital signs reviewed and stable Respiratory status: respiratory function stable Cardiovascular status: stable Postop Assessment: no headache, no backache, epidural receding, patient able to bend at knees, no signs of nausea or vomiting, adequate PO intake and able to ambulate Anesthetic complications: no   No notable events documented.  Last Vitals:  Vitals:   04/09/23 0257 04/09/23 0721  BP: 104/71 110/79  Pulse: 81 60  Resp: 18 18  Temp: 36.8 C 36.8 C  SpO2: 99% 97%    Last Pain:  Vitals:   04/09/23 0721  TempSrc: Oral  PainSc:    Pain Goal:                   [name]

## 2023-04-09 NOTE — Progress Notes (Signed)
MOB was referred for history of anxiety. * Referral screened out by Clinical Social Worker because none of the following criteria appear to apply: ~ History of anxiety/depression during this pregnancy, or of post-partum depression following prior delivery. ~ Diagnosis of anxiety and/or depression within last 3 years Per chart review, MOB was last diagnosed with anxiety 05/16/2019. No mental health concerns noted in MOB's prenatal care records.   OR * MOB's symptoms currently being treated with medication and/or therapy. Please contact the Clinical Social Worker if needs arise, by [facility] request, or if MOB scores greater than 9/yes to question 10 on Edinburgh Postpartum Depression Screen.  Signed,  Norberto Sorenson, MSW, LCSWA, LCASA 04/09/2023 10:50 AM

## 2023-04-09 NOTE — Lactation Note (Addendum)
This note was copied from a baby's chart. Lactation Consultation Note  Patient Name: Gina Stephenson [id number] Date: 04/09/2023 Age:32 hours Reason for consult: Mother's request;Difficult latch;Term;Follow-up assessment. Infant had one void and three stools since 0100 am today.  Per Birth Parent, infant has not been latching well on her left breast most infant will latch is maybe 2 minutes, she would like latch assistance, after mutltiple attempts Birth Parent was fitted with 16 mm NS, Birth Parent latched infant on her left breast using the football hold position, infant initially was on and off the breast but started sustaining her latch with 16 mm NS and breastfeed for 15 minutes, when infant came off the breast colostrum was present in NS and Birth Parent complain of cramping with latch which LC explain is normal first few days of life. LC set up DEBP due Birth Parent using 16 mm NS, Birth Parent was taught how to use hand pump and easily expressed 4 mls of colostrum that was spoon fed to infant, Birth Parent needs 18 mm breast flange but LC services is out of stock concerning breast flange size. When Birth Parent was pumping with 21 mm breast flange using DEBP she had expressed 5 mls of colostrum in flanges and still pumping as LC left the room.  Current feeding plan: 1- Birth Parent will apply 16 mm NS and then continue to latch infant at the breast according to hunger cues, 8 to 12+ times within 24 hours, skin to skin. 2- Birth Parent knows to call RN/LC for further latch assistance if needed or assistance with using 16 mm NS. 3- Birth Parent will continue to use DEBP every 3 hours for 15 minutes on initial setting and give infant back any EBM that is pumped after latching infant at the breast. Birth Parent understands that her EBM is safe at room temperature for 4 hours.   Maternal Data Has patient been taught Hand Expression?: Yes Does the patient have breastfeeding experience prior to this  delivery?: No  Feeding Mother's Current Feeding Choice: Breast Milk  LATCH Score Latch: Repeated attempts needed to sustain latch, nipple held in mouth throughout feeding, stimulation needed to elicit sucking reflex.  Audible Swallowing: A few with stimulation  Type of Nipple: Inverted ( will continue to use 16 mm NS) as work towards infant sustaining latch at the breast.  Comfort (Breast/Nipple): Soft / non-tender  Hold (Positioning): Assistance needed to correctly position infant at breast and maintain latch.  LATCH Score: 5   Lactation Tools Discussed/Used Tools: Flanges;Pump Flange Size: 21 (Birth Parent needs size 18 mm but not in stock used 21 mm) Breast pump type: Double-Electric Breast Pump Pump Education: Setup, frequency, and cleaning;Milk Storage Reason for Pumping: Infant not been latching well at breast and Birth Parent has inverted nipples, now using 16 mm NS Pumping frequency: Birth Parent will continue to pump every 3 hours for 15 minutes on inital setting. Pumped volume: 5 mL (Birth Parent was using DEBP as LC left the room.)  Interventions Interventions: Support pillows;Position options;Education;Skin to skin;Assisted with latch;Adjust position;Breast compression  Discharge Pump: DEBP (Birth Parent has Spectra 2 DEBP)  Consult Status Consult Status: Follow-up Date: 04/10/23 Follow-up type: In-patient    Frederico Hamman 04/09/2023, 7:14 PM

## 2023-04-10 ENCOUNTER — Inpatient Hospital Stay (HOSPITAL_COMMUNITY): Payer: 59

## 2023-04-10 ENCOUNTER — Inpatient Hospital Stay (HOSPITAL_COMMUNITY): Admission: RE | Admit: 2023-04-10 | Payer: 59 | Source: Home / Self Care | Admitting: Obstetrics and Gynecology

## 2023-04-10 MED ORDER — IBUPROFEN 600 MG PO TABS: 600.0000 mg | ORAL_TABLET | Freq: Four times a day (QID) | ORAL | 0 refills | Status: AC

## 2023-04-10 MED ORDER — COCONUT OIL OIL: 1.0000 | TOPICAL_OIL | Status: AC | PRN

## 2023-04-10 MED ORDER — ACETAMINOPHEN 500 MG PO TABS: 1000.0000 mg | ORAL_TABLET | Freq: Four times a day (QID) | ORAL | 0 refills | Status: AC

## 2023-04-10 MED ORDER — BENZOCAINE-MENTHOL 20-0.5 % EX AERO: 1.0000 | INHALATION_SPRAY | CUTANEOUS | Status: AC | PRN

## 2023-04-10 NOTE — Discharge Instructions (Signed)
Lactation outpatient support - home visit   Jessica Bowers, IBCLC (lactation consultant)  & Birth Doula  Phone (text or call): [phone] Email: [email or web address]   Linda Coppola RN, MHA, IBCLC at [facility]: Lactation Consultant  [email or web address] Mail: [email or web address] Tel: [phone]   Additional breastfeeding resources:  [facility] https://ibconline.ca/information-sheets/  [employer] [place]  [email or web address]   Other Resources:  Chiropractic specialist   Dr. Leanna Hastings [email or web address]   Craniosacral therapy for baby  Erin Balkind  [email or web address]  Pediatric Dentist (for tongue ties)  Dr. Kate Lambert Spangler, [facility]  Phone: [phone]  [address] [place] [address] [email or web address]  

## 2023-04-10 NOTE — Discharge Summary (Signed)
OB Discharge Summary  Patient Name: Gina Stephenson DOB: 06-06-1991 MRN: [id number]  Date of admission: 04/08/2023 Delivering provider: Neta Mends  Admitting diagnosis: Normal labor [O80, Z37.9] Intrauterine pregnancy: [redacted]w[redacted]d     Secondary diagnosis: Patient Active Problem List   Diagnosis Date Noted   Postpartum care following vaginal delivery 7/12 04/09/2023   Perineal laceration, second degree 04/09/2023   SVD (spontaneous vaginal delivery) 04/08/2023   Benign gestational thrombocytopenia (HCC) 04/08/2023   Autism spectrum disorder 03/20/2021   Migraine 05/16/2019   History of asthma - child 10/27/2009   Additional problems:none   Date of discharge: 04/10/2023   Discharge diagnosis: Principal Problem:   SVD (spontaneous vaginal delivery) Active Problems:   Benign gestational thrombocytopenia [facility])   Postpartum care following vaginal delivery 7/12   Perineal laceration, second degree                                                              Post partum procedures: none  Augmentation: AROM and Pitocin Pain control: Epidural Laceration:2nd degree;Perineal Episiotomy:None Complications: chorio, one dose Unasyn 3 gm in labor  Hospital course:  Induction of Labor With Vaginal Delivery   32 y.o. yo G1P1001 at [redacted]w[redacted]d was admitted to the hospital 04/08/2023 for induction of labor.  Indication for induction:  prodromal labor postdates .  Patient had an labor course complicated by protracted labor and maternal temperature, she was given Pitocin for augmentation and received one dose Unasyn 3 gm IVPB prior to delivery. Temperature was controlled with Tylenol 1gm IVPB. No further elevated temperature readings in postpartum period.  Membrane Rupture Time/Date: 11:47 AM,04/08/2023  Delivery Method:Vaginal, Spontaneous Episiotomy: None Lacerations:  2nd degree;Perineal Details of delivery can be found in separate delivery note.  Patient had a postpartum course complicated by  none. Patient is discharged home 04/10/23.  Newborn Data: Birth date:04/08/2023 Birth time:11:41 PM Gender:Female Living status:Living Apgars:8 ,9  Weight:3470 g  Physical exam  Vitals:   04/09/23 1107 04/09/23 1546 04/09/23 2009 04/10/23 0616  BP: 108/76 110/74 108/72 107/85  Pulse: (!) 56 60 62 76  Resp: 18 18 17    Temp:   98.1 F (36.7 C) (!) 97.4 F (36.3 C)  TempSrc:   Oral Axillary  SpO2: 98% 98% 99% 98%  Weight:      Height:       General: alert, cooperative, and no distress Lochia: appropriate Uterine Fundus: firm Incision: N/A Perineum: repair intact, no edema DVT Evaluation: No cords or calf tenderness. No significant calf/ankle edema. Labs: Lab Results  Component Value Date   WBC 17.7 (H) 04/09/2023   HGB 11.8 (L) 04/09/2023   HCT 35.9 (L) 04/09/2023   MCV 85.1 04/09/2023   PLT 113 (L) 04/09/2023      Latest Ref Rng & Units 03/20/2021    8:09 AM  CMP  Glucose 70 - 99 mg/dL 191   BUN 6 - 23 mg/dL 19   Creatinine 4.78 - 1.20 mg/dL 2.95   Sodium 621 - 308 mEq/L 138   Potassium 3.5 - 5.1 mEq/L 4.0   Chloride 96 - 112 mEq/L 105   CO2 19 - 32 mEq/L 26   Calcium 8.4 - 10.5 mg/dL 9.0   Total Protein 6.0 - 8.3 g/dL 6.8   Total Bilirubin 0.2 -  1.2 mg/dL 0.5   Alkaline Phos 39 - 117 U/L 42   AST 0 - 37 U/L 17   ALT 0 - 35 U/L 17       04/09/2023   11:49 PM 04/09/2023    1:54 AM  Edinburgh Postnatal Depression Scale Screening Tool  I have been able to laugh and see the funny side of things. 0 --  I have looked forward with enjoyment to things. 0   I have blamed myself unnecessarily when things went wrong. 1   I have been anxious or worried for no good reason. 2   I have felt scared or panicky for no good reason. 1   Things have been getting on top of me. 2   I have been so unhappy that I have had difficulty sleeping. 0   I have felt sad or miserable. 1   I have been so unhappy that I have been crying. 1   The thought of harming myself has occurred to  me. 0   Edinburgh Postnatal Depression Scale Total 8    Discharge instruction:  per After Visit Summary,  [facility] OB booklet and  "Understanding Mother & Baby Care" hospital booklet After Visit Meds:  Allergies as of 04/10/2023       Reactions   Adhesive [tape]    Localized swelling        Medication List     STOP taking these medications    FreeStyle Libre 2 Sensor Misc   rizatriptan 10 MG disintegrating tablet Commonly known as: Maxalt-MLT       TAKE these medications    acetaminophen 500 MG tablet Commonly known as: TYLENOL Take 2 tablets (1,000 mg total) by mouth every 6 (six) hours.   albuterol 108 (90 Base) MCG/ACT inhaler Commonly known as: VENTOLIN HFA Inhale 2 puffs into the lungs every 4 (four) hours as needed for wheezing or shortness of breath.   benzocaine-Menthol 20-0.5 % Aero Commonly known as: DERMOPLAST Apply 1 Application topically as needed for irritation (perineal discomfort).   butalbital-acetaminophen-caffeine 50-325-40 MG tablet Commonly known as: FIORICET Take 1-2 tablets by mouth every 8 (eight) hours as needed.   coconut oil Oil Apply 1 Application topically as needed.   ibuprofen 600 MG tablet Commonly known as: ADVIL Take 1 tablet (600 mg total) by mouth every 6 (six) hours.   prenatal multivitamin Tabs tablet Take 1 tablet by mouth daily at 12 noon.               Discharge Care Instructions  (From admission, onward)           Start     Ordered   04/10/23 0000  Discharge wound care:       Comments: Sitz baths 2 times /day with warm water x 1 week. May add herbals: 1 ounce dried comfrey leaf* 1 ounce calendula flowers 1 ounce lavender flowers  Supplies can be found online at [employer] sources at [employer], [employer]  1/2 ounce dried uva ursi leaves 1/2 ounce witch hazel blossoms (if you can find them) 1/2 ounce dried sage leaf 1/2 cup sea salt Directions: Bring 2 quarts of water to a  boil. Turn off heat, and place 1 ounce (approximately 1 large handful) of the above mixed herbs (not the salt) into the pot. Steep, covered, for 30 minutes.  Strain the liquid well with a fine mesh strainer, and discard the herb material. Add 2 quarts of liquid to the  tub, along with the 1/2 cup of salt. This medicinal liquid can also be made into compresses and peri-rinses.   04/10/23 1007           Diet: routine diet Activity: Advance as tolerated. Pelvic rest for 6 weeks.  Postpartum contraception: TBA in office Newborn Data: Live born female  Birth Weight: 7 lb 10.4 oz (3470 g) APGAR: 8, 9  Newborn Delivery   Birth date/time: 04/08/2023 23:41:45 Delivery type: Vaginal, Spontaneous     named Lauris Poag Baby Feeding: Breast Disposition:home with mother Delivery Report: Review the Delivery Report for details.   Follow up:  Follow-up Information     Neta Mends, CNM. Schedule an appointment as soon as possible for a visit in 6 week(s).   Specialty: Obstetrics and Gynecology Why: For Postpartum follow-up Contact information: [address] [place] [address] [phone]                Signed: Neta Mends, CNM, MSN 04/10/2023, 10:08 AM

## 2023-04-11 LAB — HEMOCHROMATOSIS DNA-PCR(C282Y,H63D)

## 2023-05-05 ENCOUNTER — Telehealth (HOSPITAL_COMMUNITY): Payer: Self-pay | Admitting: *Deleted

## 2023-05-05 NOTE — Telephone Encounter (Signed)
05/05/2023  Name: Gina Stephenson MRN: [id number] DOB: 22-Nov-1990  Reason for Call:  Transition of Care Hospital Discharge Call  Contact Status: Patient Contact Status: Complete  Language assistant needed: Interpreter Mode: Interpreter Not Needed        Follow-Up Questions: Do You Have Any Concerns About Your Health As You Heal From Delivery?: No Do You Have Any Concerns About Your Infants Health?: No  Edinburgh Postnatal Depression Scale:  In the Past 7 Days:  Patient said she had done this screening with the [facility] nurse in the last week and she scored a 4.  Patient did not feel the need to rescreen today.  PHQ2-9 Depression Scale:     Discharge Follow-up: Edinburgh score requires follow up?: No Patient was advised of the following resources:: Breastfeeding Support Group, Support Group Did patient express any COVID concerns?: No  Post-discharge interventions: Reviewed Newborn Safe Sleep Practices  Salena Saner, RN 05/05/2023 14:48

## 2023-06-10 ENCOUNTER — Telehealth: Payer: 59 | Admitting: Hematology

## 2023-06-22 ENCOUNTER — Encounter: Payer: Self-pay | Admitting: Family Medicine

## 2023-06-22 NOTE — Telephone Encounter (Signed)
Noted  

## 2023-06-22 NOTE — Telephone Encounter (Signed)
Called and offered pt OV with PCP for 9/27 @ 8:30 am but she declined for now. States that rash seems to be resolving and she doesn't want to hold up a slot. States will call back if needed.

## 2023-06-29 ENCOUNTER — Ambulatory Visit: Payer: 59 | Admitting: Sports Medicine

## 2023-07-05 ENCOUNTER — Encounter: Payer: Self-pay | Admitting: Sports Medicine

## 2023-07-05 ENCOUNTER — Ambulatory Visit: Payer: 59 | Admitting: Sports Medicine

## 2023-07-05 VITALS — BP 100/62 | Ht 61.0 in | Wt 127.0 lb

## 2023-07-05 DIAGNOSIS — I809 Phlebitis and thrombophlebitis of unspecified site: Secondary | ICD-10-CM | POA: Diagnosis not present

## 2023-07-05 NOTE — Progress Notes (Signed)
   Subjective:    Patient ID: Gina Stephenson, female    DOB: 01-Aug-1991, 32 y.o.   MRN: [id number]  HPI chief complaint: Left forearm pain  Gina Stephenson presents today complaining of left forearm pain has been present for about 3 to 4 weeks.  No trauma that she can recall.  She does note that this is the exact same area where she had an IV placed for recent pregnancy.  She complains of pain in the volar aspect of the forearm which is present with pulling down but not with pulling up.  She has not noticed any swelling in the area.  She does note some numbness in the forearm but it does not radiate into the hand, wrist, or shoulder.  She has tried bracing which has not been helpful.  She also tried some dry needling which was also not helpful.  Past medical history reviewed Medications reviewed Allergies reviewed  Review of Systems As above    Objective:   Physical Exam  Well-developed, fit appearing.  No acute distress  Left forearm: There is an area of induration which is tender to palpation along the volar aspect of the forearm along one of the superficial veins.  No swelling.  No ecchymosis.  Negative Finkelstein's.  Full wrist and elbow range of motion.  Good pulses.      Assessment & Plan:   Left forearm pain secondary to thrombophlebitis  Warm compresses and oral NSAIDs as needed.  She may continue with all activity including weight training using pain as her guide.  Follow-up if symptoms persist or worsen.  This note was dictated using Dragon naturally speaking software and may contain errors in syntax, spelling, or content which have not been identified prior to signing this note.

## 2023-09-07 ENCOUNTER — Other Ambulatory Visit: Payer: Self-pay | Admitting: Medical Genetics

## 2023-10-07 ENCOUNTER — Other Ambulatory Visit (HOSPITAL_COMMUNITY)
Admission: RE | Admit: 2023-10-07 | Discharge: 2023-10-07 | Disposition: A | Discharge status: Home / Self Care | Payer: Self-pay | Source: Ambulatory Visit | Attending: Medical Genetics | Admitting: Medical Genetics

## 2023-10-18 LAB — GENECONNECT MOLECULAR SCREEN: Genetic Analysis Overall Interpretation: NEGATIVE

## 2024-02-15 ENCOUNTER — Encounter: Payer: Self-pay | Admitting: Sports Medicine

## 2024-02-15 ENCOUNTER — Ambulatory Visit: Admitting: Sports Medicine

## 2024-02-15 VITALS — BP 110/60 | Ht 61.0 in | Wt 115.0 lb

## 2024-02-15 DIAGNOSIS — R2232 Localized swelling, mass and lump, left upper limb: Secondary | ICD-10-CM | POA: Diagnosis not present

## 2024-02-15 NOTE — Progress Notes (Addendum)
   Subjective:    Patient ID: Gina Stephenson, female    DOB: 1991/05/18, 33 y.o.   MRN: [id number]  HPI chief complaint: Left wrist knot  Gina Stephenson presents today with a lump on the radial aspect of her wrist which has been present for about a week.  This is the same arm that she had thrombophlebitis in in October of last year.  She has been working with a Land and her arm pain has resolved.  She is very active and lifts weights on a regular basis.  She only notices some mild discomfort with overhead exercises.  Otherwise, no pain.    Review of Systems As above    Objective:   Physical Exam  Well-developed, well-nourished.  No acute distress  Left wrist: There is a palpable, focal,indurated area of swelling over the radial styloid.  It is nontender to palpation.  Nonerythematous.  Not warm to touch.  Negative Finkelstein's.  Limited MSK ultrasound of the radial left wrist shows the area of induration to be swelling in the first extensor compartment directly over the radial styloid      Assessment & Plan:   Focal swelling of the first extensor compartment, left wrist  Gina Stephenson has no pain in this area.  She is able to exercise with minimal discomfort.  Therefore, I recommended no treatment other than watchful waiting.  If the area becomes more painful then we could consider a period of brief immobilization in a thumb spica brace.  Otherwise, follow-up as needed.  This note was dictated using Dragon naturally speaking software and may contain errors in syntax, spelling, or content which have not been identified prior to signing this note.

## 2024-02-26 ENCOUNTER — Encounter: Payer: Self-pay | Admitting: Family Medicine

## 2024-05-16 DIAGNOSIS — D239 Other benign neoplasm of skin, unspecified: Secondary | ICD-10-CM | POA: Diagnosis not present

## 2024-05-16 DIAGNOSIS — L814 Other melanin hyperpigmentation: Secondary | ICD-10-CM | POA: Diagnosis not present

## 2024-05-16 DIAGNOSIS — D1801 Hemangioma of skin and subcutaneous tissue: Secondary | ICD-10-CM | POA: Diagnosis not present
